# Patient Record
Sex: Male | Born: 1975 | Race: White | Hispanic: No | Marital: Married | Smoking: Never smoker
Health system: Southern US, Community
[De-identification: ages and names within clinical notes are randomized; demographics above are authoritative.]

## PROBLEM LIST (undated history)

## (undated) DIAGNOSIS — K219 Gastro-esophageal reflux disease without esophagitis: Secondary | ICD-10-CM

## (undated) HISTORY — PX: VASECTOMY: SHX75

## (undated) HISTORY — PX: OTHER SURGICAL HISTORY: SHX169

---

## 2013-09-04 ENCOUNTER — Emergency Department (HOSPITAL_COMMUNITY)
Admission: EM | Admit: 2013-09-04 | Discharge: 2013-09-05 | Disposition: A | Discharge status: Home / Self Care | Payer: BC Managed Care – PPO | Attending: Emergency Medicine | Admitting: Emergency Medicine

## 2013-09-04 ENCOUNTER — Encounter (HOSPITAL_COMMUNITY): Payer: Self-pay | Admitting: Emergency Medicine

## 2013-09-04 ENCOUNTER — Emergency Department (HOSPITAL_COMMUNITY): Payer: BC Managed Care – PPO

## 2013-09-04 DIAGNOSIS — K219 Gastro-esophageal reflux disease without esophagitis: Secondary | ICD-10-CM | POA: Insufficient documentation

## 2013-09-04 DIAGNOSIS — Z79899 Other long term (current) drug therapy: Secondary | ICD-10-CM | POA: Insufficient documentation

## 2013-09-04 DIAGNOSIS — R002 Palpitations: Secondary | ICD-10-CM | POA: Insufficient documentation

## 2013-09-04 LAB — BASIC METABOLIC PANEL
Anion gap: 16 — ABNORMAL HIGH (ref 5–15)
BUN: 15 mg/dL (ref 6–23)
CHLORIDE: 98 meq/L (ref 96–112)
CO2: 26 mEq/L (ref 19–32)
Calcium: 9.8 mg/dL (ref 8.4–10.5)
Creatinine, Ser: 1.01 mg/dL (ref 0.50–1.35)
GFR calc Af Amer: >90 mL/min (ref >90)
GLUCOSE: 101 mg/dL — AB (ref 70–99)
POTASSIUM: 3.9 meq/L (ref 3.7–5.3)
SODIUM: 140 meq/L (ref 137–147)

## 2013-09-04 LAB — CBC
HEMATOCRIT: 45 % (ref 39.0–52.0)
HEMOGLOBIN: 15.3 g/dL (ref 13.0–17.0)
MCH: 30.3 pg (ref 26.0–34.0)
MCHC: 34 g/dL (ref 30.0–36.0)
MCV: 89.1 fL (ref 78.0–100.0)
Platelets: 213 10*3/uL (ref 150–400)
RBC: 5.05 MIL/uL (ref 4.22–5.81)
RDW: 12.6 % (ref 11.5–15.5)
WBC: 6.6 10*3/uL (ref 4.0–10.5)

## 2013-09-04 LAB — TROPONIN I

## 2013-09-04 NOTE — ED Notes (Signed)
The pt is c/o tightness since Friday with an irregular pulse. He has not taken anything for pain.Brian Case.  No previous history.  Alert no distress

## 2013-09-05 MED ORDER — PANTOPRAZOLE SODIUM 20 MG PO TBEC: 40.0000 mg | DELAYED_RELEASE_TABLET | Freq: Every day | ORAL | Status: DC

## 2013-09-05 NOTE — Discharge Instructions (Signed)
Your workup today did not show any specific abnormalities.  Please follow up with your doctor for further workup for your palpitations if they continue.  Return to the ER for worsening condition or new concerning symptoms.   Gastroesophageal Reflux Disease, Adult Gastroesophageal reflux disease (GERD) happens when acid from your stomach flows up into the esophagus. When acid comes in contact with the esophagus, the acid causes soreness (inflammation) in the esophagus. Over time, GERD may create small holes (ulcers) in the lining of the esophagus. CAUSES   Increased body weight. This puts pressure on the stomach, making acid rise from the stomach into the esophagus.  Smoking. This increases acid production in the stomach.  Drinking alcohol. This causes decreased pressure in the lower esophageal sphincter (valve or ring of muscle between the esophagus and stomach), allowing acid from the stomach into the esophagus.  Late evening meals and a full stomach. This increases pressure and acid production in the stomach.  A malformed lower esophageal sphincter. Sometimes, no cause is found. SYMPTOMS   Burning pain in the lower part of the mid-chest behind the breastbone and in the mid-stomach area. This may occur twice a week or more often.  Trouble swallowing.  Sore throat.  Dry cough.  Asthma-like symptoms including chest tightness, shortness of breath, or wheezing. DIAGNOSIS  Your caregiver may be able to diagnose GERD based on your symptoms. In some cases, X-rays and other tests may be done to check for complications or to check the condition of your stomach and esophagus. TREATMENT  Your caregiver may recommend over-the-counter or prescription medicines to help decrease acid production. Ask your caregiver before starting or adding any new medicines.  HOME CARE INSTRUCTIONS   Change the factors that you can control. Ask your caregiver for guidance concerning weight loss, quitting smoking,  and alcohol consumption.  Avoid foods and drinks that make your symptoms worse, such as:  Caffeine or alcoholic drinks.  Chocolate.  Peppermint or mint flavorings.  Garlic and onions.  Spicy foods.  Citrus fruits, such as oranges, lemons, or limes.  Tomato-based foods such as sauce, chili, salsa, and pizza.  Fried and fatty foods.  Avoid lying down for the 3 hours prior to your bedtime or prior to taking a nap.  Eat small, frequent meals instead of large meals.  Wear loose-fitting clothing. Do not wear anything tight around your waist that causes pressure on your stomach.  Raise the head of your bed 6 to 8 inches with wood blocks to help you sleep. Extra pillows will not help.  Only take over-the-counter or prescription medicines for pain, discomfort, or fever as directed by your caregiver.  Do not take aspirin, ibuprofen, or other nonsteroidal anti-inflammatory drugs (NSAIDs). SEEK IMMEDIATE MEDICAL CARE IF:   You have pain in your arms, neck, jaw, teeth, or back.  Your pain increases or changes in intensity or duration.  You develop nausea, vomiting, or sweating (diaphoresis).  You develop shortness of breath, or you faint.  Your vomit is green, yellow, black, or looks like coffee grounds or blood.  Your stool is red, bloody, or black. These symptoms could be signs of other problems, such as heart disease, gastric bleeding, or esophageal bleeding. MAKE SURE YOU:   Understand these instructions.  Will watch your condition.  Will get help right away if you are not doing well or get worse. Document Released: 11/20/2004 Document Revised: 05/05/2011 Document Reviewed: 08/30/2010 [facility] Patient Information 2015 [place], [place]. This information is not intended to  replace advice given to you by your health care provider. Make sure you discuss any questions you have with your health care provider.  Food Choices for Gastroesophageal Reflux Disease When you have  gastroesophageal reflux disease (GERD), the foods you eat and your eating habits are very important. Choosing the right foods can help ease the discomfort of GERD. WHAT GENERAL GUIDELINES DO I NEED TO FOLLOW?  Choose fruits, vegetables, whole grains, low-fat dairy products, and low-fat meat, fish, and poultry.  Limit fats such as oils, salad dressings, butter, nuts, and avocado.  Keep a food diary to identify foods that cause symptoms.  Avoid foods that cause reflux. These may be different for different people.  Eat frequent small meals instead of three large meals each day.  Eat your meals slowly, in a relaxed setting.  Limit fried foods.  Cook foods using methods other than frying.  Avoid drinking alcohol.  Avoid drinking large amounts of liquids with your meals.  Avoid bending over or lying down until 2-3 hours after eating. WHAT FOODS ARE NOT RECOMMENDED? The following are some foods and drinks that may worsen your symptoms: Vegetables Tomatoes. Tomato juice. Tomato and spaghetti sauce. Chili peppers. Onion and garlic. Horseradish. Fruits Oranges, grapefruit, and lemon (fruit and juice). Meats High-fat meats, fish, and poultry. This includes hot dogs, ribs, ham, sausage, salami, and bacon. Dairy Whole milk and chocolate milk. Sour cream. Cream. Butter. Ice cream. Cream cheese.  Beverages Coffee and tea, with or without caffeine. Carbonated beverages or energy drinks. Condiments Hot sauce. Barbecue sauce.  Sweets/Desserts Chocolate and cocoa. Donuts. Peppermint and spearmint. Fats and Oils High-fat foods, including [place] fries and potato chips. Other Vinegar. Strong spices, such as black pepper, white pepper, red pepper, cayenne, curry powder, cloves, ginger, and chili powder. The items listed above may not be a complete list of foods and beverages to avoid. Contact your dietitian for more information. Document Released: 02/10/2005 Document Revised: 02/15/2013  Document Reviewed: 12/15/2012 [facility] Patient Information 2015 [place]. This information is not intended to replace advice given to you by your health care provider. Make sure you discuss any questions you have with your health care provider.  Palpitations A palpitation is the feeling that your heartbeat is irregular or is faster than normal. It may feel like your heart is fluttering or skipping a beat. Palpitations are usually not a serious problem. However, in some cases, you may need further medical evaluation. CAUSES  Palpitations can be caused by:  Smoking.  Caffeine or other stimulants, such as diet pills or energy drinks.  Alcohol.  Stress and anxiety.  Strenuous physical activity.  Fatigue.  Certain medicines.  Heart disease, especially if you have a history of irregular heart rhythms (arrhythmias), such as atrial fibrillation, atrial flutter, or supraventricular tachycardia.  An improperly working pacemaker or defibrillator. DIAGNOSIS  To find the cause of your palpitations, your health care provider will take your medical history and perform a physical exam. Your health care provider may also have you take a test called an ambulatory electrocardiogram (ECG). An ECG records your heartbeat patterns over a 24-hour period. You may also have other tests, such as:  Transthoracic echocardiogram (TTE). During echocardiography, sound waves are used to evaluate how blood flows through your heart.  Transesophageal echocardiogram (TEE).  Cardiac monitoring. This allows your health care provider to monitor your heart rate and rhythm in real time.  Holter monitor. This is a portable device that records your heartbeat and can help diagnose heart  arrhythmias. It allows your health care provider to track your heart activity for several days, if needed.  Stress tests by exercise or by giving medicine that makes the heart beat faster. TREATMENT  Treatment of palpitations depends  on the cause of your symptoms and can vary greatly. Most cases of palpitations do not require any treatment other than time, relaxation, and monitoring your symptoms. Other causes, such as atrial fibrillation, atrial flutter, or supraventricular tachycardia, usually require further treatment. HOME CARE INSTRUCTIONS   Avoid:  Caffeinated coffee, tea, soft drinks, diet pills, and energy drinks.  Chocolate.  Alcohol.  Stop smoking if you smoke.  Reduce your stress and anxiety. Things that can help you relax include:  A method of controlling things in your body, such as your heartbeats, with your mind (biofeedback).  Yoga.  Meditation.  Physical activity such as swimming, jogging, or walking.  Get plenty of rest and sleep. SEEK MEDICAL CARE IF:   You continue to have a fast or irregular heartbeat beyond 24 hours.  Your palpitations occur more often. SEEK IMMEDIATE MEDICAL CARE IF:  You have chest pain or shortness of breath.  You have a severe headache.  You feel dizzy or you faint. MAKE SURE YOU:  Understand these instructions.  Will watch your condition.  Will get help right away if you are not doing well or get worse. Document Released: 02/08/2000 Document Revised: 02/15/2013 Document Reviewed: 04/11/2011 [facility] Patient Information 2015 [place], [place]. This information is not intended to replace advice given to you by your health care provider. Make sure you discuss any questions you have with your health care provider.

## 2013-09-05 NOTE — ED Provider Notes (Signed)
CSN: [id number]     Arrival date & time 09/04/13  2225 History   First MD Initiated Contact with Patient 09/05/13 0000     Chief Complaint  Patient presents with  . Chest Pain     (Consider location/radiation/quality/duration/timing/severity/associated sxs/prior Treatment) HPI Shrill male presents to emergency department with complaint of palpitations reflux symptoms and burping.  Patient reports eating a large Chinese meal Friday and afterwards noted including repeating acetate the back of his mouth.  With some burning in his throat.  Patient on Saturday he reports he felt generally unwell, no specific complaints, just general malaise.  That evening when he was lying down he noticed that he was having extra beats.  At times he felt like his heart was racing.  Patient had persistent symptoms today.  He denies any specific chest pain shortness of breath leg swelling prolonged immobilization.  He is nonsmoker.  No family history of PE DVT or heart disease.  Patient reports he has not been exercising as regularly as he normally does for the past month, prior to that time was running regularly.  Patient has been under more stress recently.  He normally drinks a cup of coffee a day.  History reviewed. No pertinent past medical history. History reviewed. No pertinent past surgical history. No family history on file. History  Substance Use Topics  . Smoking status: Never Smoker   . Smokeless tobacco: Not on file  . Alcohol Use: Yes    Review of Systems   See History of Present Illness; otherwise all other systems are reviewed and negative  Allergies  Review of patient's allergies indicates no known allergies.  Home Medications   Prior to Admission medications   Medication Sig Start Date End Date Taking? Authorizing Provider  pantoprazole (PROTONIX) 20 MG tablet Take 2 tablets (40 mg total) by mouth daily. 09/05/13   Olivia Mackie, MD   BP 122/83  Pulse 56  Temp(Src) 97.9 F (36.6 C)  (Oral)  Resp 18  Ht 6\' 2"  (1.88 m)  Wt 200 lb (90.719 kg)  BMI 25.67 kg/m2  SpO2 98% Physical Exam  Nursing note and vitals reviewed. Constitutional: He is oriented to person, place, and time. He appears well-developed and well-nourished.  HENT:  Head: Normocephalic and atraumatic.  Nose: Nose normal.  Mouth/Throat: Oropharynx is clear and moist.  Eyes: Conjunctivae and EOM are normal. Pupils are equal, round, and reactive to light.  Neck: Normal range of motion. Neck supple. No JVD present. No tracheal deviation present. No thyromegaly present.  Cardiovascular: Normal rate, regular rhythm, normal heart sounds and intact distal pulses.  Exam reveals no gallop and no friction rub.   No murmur heard. Pulmonary/Chest: Effort normal and breath sounds normal. No stridor. No respiratory distress. He has no wheezes. He has no rales. He exhibits no tenderness.  Abdominal: Soft. Bowel sounds are normal. He exhibits no distension and no mass. There is no tenderness. There is no rebound and no guarding.  Musculoskeletal: Normal range of motion. He exhibits no edema and no tenderness.  Lymphadenopathy:    He has no cervical adenopathy.  Neurological: He is alert and oriented to person, place, and time. He exhibits normal muscle tone. Coordination normal.  Skin: Skin is dry. No rash noted. No erythema. No pallor.  Psychiatric: He has a normal mood and affect. His behavior is normal. Judgment and thought content normal.    ED Course  Procedures (including critical care time) Labs Review Labs Reviewed  BASIC  METABOLIC PANEL - Abnormal; Notable for the following:    Glucose, Bld 101 (*)    Anion gap 16 (*)    All other components within normal limits  CBC  TROPONIN I    Imaging Review Dg Chest 2 View  09/04/2013   CLINICAL DATA:  Chest pain  EXAM: CHEST  2 VIEW  COMPARISON:  None.  FINDINGS: Lungs are clear. The heart size and pulmonary vascularity are normal. No adenopathy. No pneumothorax.  No bone lesions.  IMPRESSION: No abnormality noted.   Electronically Signed   By: Bretta BangWilliam  Woodruff M.D.   On: 09/04/2013 23:18     EKG Interpretation   Date/Time:  Sunday September 04 2013 22:36:02 EDT Ventricular Rate:  61 PR Interval:  126 QRS Duration: 98 QT Interval:  386 QTC Calculation: 388 R Axis:   69 Text Interpretation:  Normal sinus rhythm Nonspecific ST and T wave  abnormality Abnormal ECG No old tracing to compare Confirmed by Tavon Magnussen  MD,  Beck Cofer ([id number]) on 09/05/2013 12:05:25 AM      MDM   Final diagnoses:  Palpitations  Gastroesophageal reflux disease without esophagitis    a 38 year old male with 3 days of intermittent palpitations, reflux symptoms.  His workup has unremarkable, has no specific risk factors.  Patient advised that should symptoms continue with palpitations should followup with primary care doctor for Holter monitor.  We'll start him on Protonix for reflux symptoms.  Olivia Mackielga M Anurag Scarfo, MD 09/05/13 [phone]

## 2014-10-05 ENCOUNTER — Other Ambulatory Visit: Payer: Self-pay | Admitting: Surgical

## 2014-10-06 NOTE — Patient Instructions (Addendum)
Brian Case  10/06/2014   Your procedure is scheduled on: Wednesday 10/11/2014  Report to [facility] Main  Entrance take [facility]  elevators to 3rd floor to  Short Stay Center at  0900 AM.  Call this number if you have problems the morning of surgery [phone]   Remember: ONLY 1 PERSON MAY GO WITH YOU TO SHORT STAY TO GET  READY MORNING OF YOUR SURGERY.  Do not eat food or drink liquids :After Midnight.     Take these medicines the morning of surgery with A SIP OF WATER: none                                You may not have any metal on your body including hair pins and              piercings  Do not wear jewelry,  lotions, powders or perfumes, deodorant                        Men may shave face and neck.   Do not bring valuables to the hospital. Brian Case IS NOT             RESPONSIBLE   FOR VALUABLES.  Contacts, dentures or bridgework may not be worn into surgery.  Leave suitcase in the car. After surgery it may be brought to your room.     Patients discharged the day of surgery will not be allowed to drive home.  Name and phone number of your driver:  Special Instructions: coughing and deep breathing exercises, leg exercises               Please read over the following fact sheets you were given: _____________________________________________________________________             [facility] - Preparing for Surgery Before surgery, you can play an important role.  Because skin is not sterile, your skin needs to be as free of germs as possible.  You can reduce the number of germs on your skin by washing with CHG (chlorahexidine gluconate) soap before surgery.  CHG is an antiseptic cleaner which kills germs and bonds with the skin to continue killing germs even after washing. Please DO NOT use if you have an allergy to CHG or antibacterial soaps.  If your skin becomes reddened/irritated stop using the CHG and inform your nurse when you arrive at Short  Stay. Do not shave (including legs and underarms) for at least 48 hours prior to the first CHG shower.  You may shave your face/neck. Please follow these instructions carefully:  1.  Shower with CHG Soap the night before surgery and the  morning of Surgery.  2.  If you choose to wash your hair, wash your hair first as usual with your  normal  shampoo.  3.  After you shampoo, rinse your hair and body thoroughly to remove the  shampoo.                           4.  Use CHG as you would any other liquid soap.  You can apply chg directly  to the skin and wash  Gently with a scrungie or clean washcloth.  5.  Apply the CHG Soap to your body ONLY FROM THE NECK DOWN.   Do not use on face/ open                           Wound or open sores. Avoid contact with eyes, ears mouth and genitals (private parts).                       Wash face,  Genitals (private parts) with your normal soap.             6.  Wash thoroughly, paying special attention to the area where your surgery  will be performed.  7.  Thoroughly rinse your body with warm water from the neck down.  8.  DO NOT shower/wash with your normal soap after using and rinsing off  the CHG Soap.                9.  Pat yourself dry with a clean towel.            10.  Wear clean pajamas.            11.  Place clean sheets on your bed the night of your first shower and do not  sleep with pets. Day of Surgery : Do not apply any lotions/deodorants the morning of surgery.  Please wear clean clothes to the hospital/surgery center.  FAILURE TO FOLLOW THESE INSTRUCTIONS MAY RESULT IN THE CANCELLATION OF YOUR SURGERY PATIENT SIGNATURE_________________________________  NURSE SIGNATURE__________________________________  ________________________________________________________________________   Brian Case  An incentive spirometer is a tool that can help keep your lungs clear and active. This tool measures how well you are  filling your lungs with each breath. Taking long deep breaths may help reverse or decrease the chance of developing breathing (pulmonary) problems (especially infection) following:  A long period of time when you are unable to move or be active. BEFORE THE PROCEDURE   If the spirometer includes an indicator to show your best effort, your nurse or respiratory therapist will set it to a desired goal.  If possible, sit up straight or lean slightly forward. Try not to slouch.  Hold the incentive spirometer in an upright position. INSTRUCTIONS FOR USE   Sit on the edge of your bed if possible, or sit up as far as you can in bed or on a chair.  Hold the incentive spirometer in an upright position.  Breathe out normally.  Place the mouthpiece in your mouth and seal your lips tightly around it.  Breathe in slowly and as deeply as possible, raising the piston or the ball toward the top of the column.  Hold your breath for 3-5 seconds or for as long as possible. Allow the piston or ball to fall to the bottom of the column.  Remove the mouthpiece from your mouth and breathe out normally.  Rest for a few seconds and repeat Steps 1 through 7 at least 10 times every 1-2 hours when you are awake. Take your time and take a few normal breaths between deep breaths.  The spirometer may include an indicator to show your best effort. Use the indicator as a goal to work toward during each repetition.  After each set of 10 deep breaths, practice coughing to be sure your lungs are clear. If you have an incision (the cut made at the time of surgery),  support your incision when coughing by placing a pillow or rolled up towels firmly against it. Once you are able to get out of bed, walk around indoors and cough well. You may stop using the incentive spirometer when instructed by your caregiver.  RISKS AND COMPLICATIONS  Take your time so you do not get dizzy or light-headed.  If you are in pain, you may  need to take or ask for pain medication before doing incentive spirometry. It is harder to take a deep breath if you are having pain. AFTER USE  Rest and breathe slowly and easily.  It can be helpful to keep track of a log of your progress. Your caregiver can provide you with a simple table to help with this. If you are using the spirometer at home, follow these instructions: [place] IF:   You are having difficultly using the spirometer.  You have trouble using the spirometer as often as instructed.  Your pain medication is not giving enough relief while using the spirometer.  You develop fever of 100.5 F (38.1 C) or higher. SEEK IMMEDIATE MEDICAL CARE IF:   You cough up bloody sputum that had not been present before.  You develop fever of 102 F (38.9 C) or greater.  You develop worsening pain at or near the incision site. MAKE SURE YOU:   Understand these instructions.  Will watch your condition.  Will get help right away if you are not doing well or get worse. Document Released: 06/23/2006 Document Revised: 05/05/2011 Document Reviewed: 08/24/2006 Exit[employer] Patient Information 2014 Exit[employer], [place].   ________________________________________________________________________  WHAT IS A BLOOD TRANSFUSION? Blood Transfusion Information  A transfusion is the replacement of blood or some of its parts. Blood is made up of multiple cells which provide different functions.  Red blood cells carry oxygen and are used for blood loss replacement.  White blood cells fight against infection.  Platelets control bleeding.  Plasma helps clot blood.  Other blood products are available for specialized needs, such as hemophilia or other clotting disorders. BEFORE THE TRANSFUSION  Who gives blood for transfusions?   Healthy volunteers who are fully evaluated to make sure their blood is safe. This is blood bank blood. Transfusion therapy is the safest it has ever been in  the practice of medicine. Before blood is taken from a donor, a complete history is taken to make sure that person has no history of diseases nor engages in risky social behavior (examples are intravenous drug use or sexual activity with multiple partners). The donor's travel history is screened to minimize risk of transmitting infections, such as malaria. The donated blood is tested for signs of infectious diseases, such as HIV and hepatitis. The blood is then tested to be sure it is compatible with you in order to minimize the chance of a transfusion reaction. If you or a relative donates blood, this is often done in anticipation of surgery and is not appropriate for emergency situations. It takes many days to process the donated blood. RISKS AND COMPLICATIONS Although transfusion therapy is very safe and saves many lives, the main dangers of transfusion include:   Getting an infectious disease.  Developing a transfusion reaction. This is an allergic reaction to something in the blood you were given. Every precaution is taken to prevent this. The decision to have a blood transfusion has been considered carefully by your caregiver before blood is given. Blood is not given unless the benefits outweigh the risks. AFTER THE TRANSFUSION  Right after receiving a blood transfusion, you will usually feel much better and more energetic. This is especially true if your red blood cells have gotten low (anemic). The transfusion raises the level of the red blood cells which carry oxygen, and this usually causes an energy increase.  The nurse administering the transfusion will monitor you carefully for complications. HOME CARE INSTRUCTIONS  No special instructions are needed after a transfusion. You may find your energy is better. Speak with your caregiver about any limitations on activity for underlying diseases you may have. SEEK MEDICAL CARE IF:   Your condition is not improving after your  transfusion.  You develop redness or irritation at the intravenous (IV) site. SEEK IMMEDIATE MEDICAL CARE IF:  Any of the following symptoms occur over the next 12 hours:  Shaking chills.  You have a temperature by mouth above 102 F (38.9 C), not controlled by medicine.  Chest, back, or muscle pain.  People around you feel you are not acting correctly or are confused.  Shortness of breath or difficulty breathing.  Dizziness and fainting.  You get a rash or develop hives.  You have a decrease in urine output.  Your urine turns a dark color or changes to pink, red, or brown. Any of the following symptoms occur over the next 10 days:  You have a temperature by mouth above 102 F (38.9 C), not controlled by medicine.  Shortness of breath.  Weakness after normal activity.  The white part of the eye turns yellow (jaundice).  You have a decrease in the amount of urine or are urinating less often.  Your urine turns a dark color or changes to pink, red, or brown. Document Released: 02/08/2000 Document Revised: 05/05/2011 Document Reviewed: 09/27/2007 [facility] Patient Information 2014 [place], [place].  _______________________________________________________________________

## 2014-10-09 ENCOUNTER — Ambulatory Visit (HOSPITAL_COMMUNITY)
Admission: RE | Admit: 2014-10-09 | Discharge: 2014-10-09 | Disposition: A | Discharge status: Home / Self Care | Payer: BLUE CROSS/BLUE SHIELD | Source: Ambulatory Visit | Attending: Surgical | Admitting: Surgical

## 2014-10-09 ENCOUNTER — Encounter (HOSPITAL_COMMUNITY): Payer: Self-pay

## 2014-10-09 ENCOUNTER — Encounter (HOSPITAL_COMMUNITY)
Admission: RE | Admit: 2014-10-09 | Discharge: 2014-10-09 | Disposition: A | Discharge status: Home / Self Care | Payer: BLUE CROSS/BLUE SHIELD | Source: Ambulatory Visit | Attending: Orthopedic Surgery | Admitting: Orthopedic Surgery

## 2014-10-09 DIAGNOSIS — Z01818 Encounter for other preprocedural examination: Secondary | ICD-10-CM | POA: Diagnosis present

## 2014-10-09 DIAGNOSIS — M5136 Other intervertebral disc degeneration, lumbar region: Secondary | ICD-10-CM | POA: Insufficient documentation

## 2014-10-09 DIAGNOSIS — R9431 Abnormal electrocardiogram [ECG] [EKG]: Secondary | ICD-10-CM | POA: Diagnosis not present

## 2014-10-09 DIAGNOSIS — Z01812 Encounter for preprocedural laboratory examination: Secondary | ICD-10-CM | POA: Diagnosis not present

## 2014-10-09 HISTORY — DX: Gastro-esophageal reflux disease without esophagitis: K21.9

## 2014-10-09 LAB — SURGICAL PCR SCREEN
MRSA, PCR: NEGATIVE
Staphylococcus aureus: NEGATIVE

## 2014-10-09 LAB — CBC WITH DIFFERENTIAL/PLATELET
Basophils Absolute: 0 10*3/uL (ref 0.0–0.1)
Basophils Relative: 0 % (ref 0–1)
Eosinophils Absolute: 0 10*3/uL (ref 0.0–0.7)
Eosinophils Relative: 0 % (ref 0–5)
HCT: 48.4 % (ref 39.0–52.0)
Hemoglobin: 16.5 g/dL (ref 13.0–17.0)
Lymphocytes Relative: 13 % (ref 12–46)
Lymphs Abs: 0.8 10*3/uL (ref 0.7–4.0)
MCH: 30.3 pg (ref 26.0–34.0)
MCHC: 34.1 g/dL (ref 30.0–36.0)
MCV: 89 fL (ref 78.0–100.0)
Monocytes Absolute: 0.1 10*3/uL (ref 0.1–1.0)
Monocytes Relative: 2 % — ABNORMAL LOW (ref 3–12)
Neutro Abs: 5.3 10*3/uL (ref 1.7–7.7)
Neutrophils Relative %: 85 % — ABNORMAL HIGH (ref 43–77)
Platelets: 271 10*3/uL (ref 150–400)
RBC: 5.44 MIL/uL (ref 4.22–5.81)
RDW: 12.5 % (ref 11.5–15.5)
WBC: 6.3 10*3/uL (ref 4.0–10.5)

## 2014-10-09 LAB — COMPREHENSIVE METABOLIC PANEL
ALT: 29 U/L (ref 17–63)
AST: 27 U/L (ref 15–41)
Albumin: 4.8 g/dL (ref 3.5–5.0)
Alkaline Phosphatase: 73 U/L (ref 38–126)
Anion gap: 10 (ref 5–15)
BUN: 13 mg/dL (ref 6–20)
CO2: 26 mmol/L (ref 22–32)
Calcium: 10 mg/dL (ref 8.9–10.3)
Chloride: 102 mmol/L (ref 101–111)
Creatinine, Ser: 0.84 mg/dL (ref 0.61–1.24)
GFR calc Af Amer: >60 mL/min (ref >60)
GFR calc non Af Amer: >60 mL/min (ref >60)
Glucose, Bld: 123 mg/dL — ABNORMAL HIGH (ref 65–99)
Potassium: 4.1 mmol/L (ref 3.5–5.1)
Sodium: 138 mmol/L (ref 135–145)
Total Bilirubin: 0.6 mg/dL (ref 0.3–1.2)
Total Protein: 7.8 g/dL (ref 6.5–8.1)

## 2014-10-09 LAB — PROTIME-INR
INR: 0.94 (ref 0.00–1.49)
Prothrombin Time: 12.8 seconds (ref 11.6–15.2)

## 2014-10-11 ENCOUNTER — Ambulatory Visit (HOSPITAL_COMMUNITY): Payer: BLUE CROSS/BLUE SHIELD

## 2014-10-11 ENCOUNTER — Encounter (HOSPITAL_COMMUNITY)
Admission: RE | Disposition: A | Discharge status: Home / Self Care | Payer: Self-pay | Source: Ambulatory Visit | Attending: Orthopedic Surgery

## 2014-10-11 ENCOUNTER — Encounter (HOSPITAL_COMMUNITY): Payer: Self-pay | Admitting: *Deleted

## 2014-10-11 ENCOUNTER — Observation Stay (HOSPITAL_COMMUNITY)
Admission: RE | Admit: 2014-10-11 | Discharge: 2014-10-12 | Disposition: A | Discharge status: Home / Self Care | Payer: BLUE CROSS/BLUE SHIELD | Source: Ambulatory Visit | Attending: Orthopedic Surgery | Admitting: Orthopedic Surgery

## 2014-10-11 ENCOUNTER — Ambulatory Visit (HOSPITAL_COMMUNITY): Payer: BLUE CROSS/BLUE SHIELD | Admitting: Anesthesiology

## 2014-10-11 DIAGNOSIS — M5127 Other intervertebral disc displacement, lumbosacral region: Secondary | ICD-10-CM | POA: Diagnosis not present

## 2014-10-11 DIAGNOSIS — K219 Gastro-esophageal reflux disease without esophagitis: Secondary | ICD-10-CM | POA: Insufficient documentation

## 2014-10-11 DIAGNOSIS — M4807 Spinal stenosis, lumbosacral region: Principal | ICD-10-CM | POA: Insufficient documentation

## 2014-10-11 DIAGNOSIS — M48062 Spinal stenosis, lumbar region with neurogenic claudication: Secondary | ICD-10-CM | POA: Diagnosis present

## 2014-10-11 DIAGNOSIS — Z79899 Other long term (current) drug therapy: Secondary | ICD-10-CM | POA: Insufficient documentation

## 2014-10-11 DIAGNOSIS — R29898 Other symptoms and signs involving the musculoskeletal system: Secondary | ICD-10-CM | POA: Insufficient documentation

## 2014-10-11 DIAGNOSIS — Z7952 Long term (current) use of systemic steroids: Secondary | ICD-10-CM | POA: Insufficient documentation

## 2014-10-11 DIAGNOSIS — Z419 Encounter for procedure for purposes other than remedying health state, unspecified: Secondary | ICD-10-CM

## 2014-10-11 HISTORY — PX: HEMI-MICRODISCECTOMY LUMBAR LAMINECTOMY LEVEL 1: SHX5846

## 2014-10-11 SURGERY — HEMI-MICRODISCECTOMY LUMBAR LAMINECTOMY LEVEL 1
Anesthesia: General | Site: Back | Laterality: Left

## 2014-10-11 MED ORDER — HYDROCODONE-ACETAMINOPHEN 5-325 MG PO TABS: 1.0000 | ORAL_TABLET | ORAL | Status: DC | PRN

## 2014-10-11 MED ORDER — SUGAMMADEX SODIUM 200 MG/2ML IV SOLN
INTRAVENOUS | Status: AC
  Filled 2014-10-11: qty 2

## 2014-10-11 MED ORDER — CEFAZOLIN SODIUM-DEXTROSE 2-3 GM-% IV SOLR
INTRAVENOUS | Status: AC
  Filled 2014-10-11: qty 50

## 2014-10-11 MED ORDER — HYDROMORPHONE HCL 1 MG/ML IJ SOLN
INTRAMUSCULAR | Status: AC
  Filled 2014-10-11: qty 1

## 2014-10-11 MED ORDER — SODIUM CHLORIDE 0.9 % IR SOLN
Status: AC
  Filled 2014-10-11: qty 1

## 2014-10-11 MED ORDER — FENTANYL CITRATE (PF) 250 MCG/5ML IJ SOLN
INTRAMUSCULAR | Status: DC | PRN
  Administered 2014-10-11: 150 ug via INTRAVENOUS
  Administered 2014-10-11 (×2): 50 ug via INTRAVENOUS

## 2014-10-11 MED ORDER — CEFAZOLIN SODIUM-DEXTROSE 2-3 GM-% IV SOLR
2.0000 g | INTRAVENOUS | Status: AC
  Administered 2014-10-11: 2 g via INTRAVENOUS

## 2014-10-11 MED ORDER — BACITRACIN-NEOMYCIN-POLYMYXIN 400-5-5000 EX OINT
TOPICAL_OINTMENT | CUTANEOUS | Status: AC
  Filled 2014-10-11: qty 1

## 2014-10-11 MED ORDER — BUPIVACAINE-EPINEPHRINE (PF) 0.5% -1:200000 IJ SOLN
INTRAMUSCULAR | Status: AC
  Filled 2014-10-11: qty 30

## 2014-10-11 MED ORDER — KETOROLAC TROMETHAMINE 30 MG/ML IJ SOLN
INTRAMUSCULAR | Status: AC
  Filled 2014-10-11: qty 1

## 2014-10-11 MED ORDER — BISACODYL 5 MG PO TBEC: 5.0000 mg | DELAYED_RELEASE_TABLET | Freq: Every day | ORAL | Status: DC | PRN

## 2014-10-11 MED ORDER — MIDAZOLAM HCL 2 MG/2ML IJ SOLN
INTRAMUSCULAR | Status: AC
  Filled 2014-10-11: qty 4

## 2014-10-11 MED ORDER — ACETAMINOPHEN 325 MG PO TABS: 650.0000 mg | ORAL_TABLET | ORAL | Status: DC | PRN

## 2014-10-11 MED ORDER — CHLORHEXIDINE GLUCONATE 4 % EX LIQD: 60.0000 mL | Freq: Once | CUTANEOUS | Status: DC

## 2014-10-11 MED ORDER — HYDROMORPHONE HCL 1 MG/ML IJ SOLN: 0.5000 mg | INTRAMUSCULAR | Status: DC | PRN

## 2014-10-11 MED ORDER — BUPIVACAINE-EPINEPHRINE (PF) 0.5% -1:200000 IJ SOLN
INTRAMUSCULAR | Status: DC | PRN
  Administered 2014-10-11: 20 mL

## 2014-10-11 MED ORDER — MENTHOL 3 MG MT LOZG: 1.0000 | LOZENGE | OROMUCOSAL | Status: DC | PRN

## 2014-10-11 MED ORDER — PROMETHAZINE HCL 25 MG/ML IJ SOLN: 6.2500 mg | INTRAMUSCULAR | Status: DC | PRN

## 2014-10-11 MED ORDER — HYDROMORPHONE HCL 1 MG/ML IJ SOLN
INTRAMUSCULAR | Status: DC | PRN
  Administered 2014-10-11 (×2): 1 mg via INTRAVENOUS

## 2014-10-11 MED ORDER — SUGAMMADEX SODIUM 200 MG/2ML IV SOLN
INTRAVENOUS | Status: DC | PRN
  Administered 2014-10-11: 200 mg via INTRAVENOUS

## 2014-10-11 MED ORDER — PROPOFOL 10 MG/ML IV BOLUS
INTRAVENOUS | Status: DC | PRN
  Administered 2014-10-11: 200 mg via INTRAVENOUS

## 2014-10-11 MED ORDER — ONDANSETRON HCL 4 MG/2ML IJ SOLN: 4.0000 mg | INTRAMUSCULAR | Status: DC | PRN

## 2014-10-11 MED ORDER — DEXAMETHASONE SODIUM PHOSPHATE 10 MG/ML IJ SOLN
INTRAMUSCULAR | Status: DC | PRN
Start: 2014-10-11 — End: 2014-10-11
  Administered 2014-10-11: 10 mg via INTRAVENOUS

## 2014-10-11 MED ORDER — PROPOFOL 10 MG/ML IV BOLUS
INTRAVENOUS | Status: AC
  Filled 2014-10-11: qty 20

## 2014-10-11 MED ORDER — METHOCARBAMOL 500 MG PO TABS: 500.0000 mg | ORAL_TABLET | Freq: Four times a day (QID) | ORAL | Status: DC | PRN

## 2014-10-11 MED ORDER — DEXAMETHASONE SODIUM PHOSPHATE 10 MG/ML IJ SOLN
INTRAMUSCULAR | Status: AC
  Filled 2014-10-11: qty 1

## 2014-10-11 MED ORDER — LIDOCAINE HCL (CARDIAC) 20 MG/ML IV SOLN
INTRAVENOUS | Status: AC
  Filled 2014-10-11: qty 5

## 2014-10-11 MED ORDER — ONDANSETRON HCL 4 MG/2ML IJ SOLN
INTRAMUSCULAR | Status: AC
  Filled 2014-10-11: qty 2

## 2014-10-11 MED ORDER — POLYETHYLENE GLYCOL 3350 17 G PO PACK: 17.0000 g | PACK | Freq: Every day | ORAL | Status: DC | PRN

## 2014-10-11 MED ORDER — ROCURONIUM BROMIDE 100 MG/10ML IV SOLN
INTRAVENOUS | Status: DC | PRN
  Administered 2014-10-11 (×2): 10 mg via INTRAVENOUS
  Administered 2014-10-11: 50 mg via INTRAVENOUS
  Administered 2014-10-11 (×2): 10 mg via INTRAVENOUS

## 2014-10-11 MED ORDER — LIDOCAINE HCL (CARDIAC) 20 MG/ML IV SOLN
INTRAVENOUS | Status: DC | PRN
  Administered 2014-10-11: 50 mg via INTRAVENOUS

## 2014-10-11 MED ORDER — ROCURONIUM BROMIDE 100 MG/10ML IV SOLN
INTRAVENOUS | Status: AC
  Filled 2014-10-11: qty 1

## 2014-10-11 MED ORDER — BUPIVACAINE LIPOSOME 1.3 % IJ SUSP
20.0000 mL | Freq: Once | INTRAMUSCULAR | Status: AC
  Administered 2014-10-11: 20 mL
  Filled 2014-10-11: qty 20

## 2014-10-11 MED ORDER — PANTOPRAZOLE SODIUM 40 MG PO TBEC: 40.0000 mg | DELAYED_RELEASE_TABLET | Freq: Every day | ORAL | Status: DC

## 2014-10-11 MED ORDER — CEFAZOLIN SODIUM 1-5 GM-% IV SOLN
1.0000 g | Freq: Three times a day (TID) | INTRAVENOUS | Status: DC
  Administered 2014-10-11 – 2014-10-12 (×2): 1 g via INTRAVENOUS
  Filled 2014-10-11 (×3): qty 50

## 2014-10-11 MED ORDER — LACTATED RINGERS IV SOLN
INTRAVENOUS | Status: DC
  Administered 2014-10-11: 18:00:00 via INTRAVENOUS

## 2014-10-11 MED ORDER — GELATIN ABSORBABLE MT POWD
OROMUCOSAL | Status: DC | PRN
  Administered 2014-10-11: 10 mL via TOPICAL

## 2014-10-11 MED ORDER — LACTATED RINGERS IV SOLN
INTRAVENOUS | Status: DC
  Administered 2014-10-11: 1000 mL via INTRAVENOUS
  Administered 2014-10-11: 13:00:00 via INTRAVENOUS

## 2014-10-11 MED ORDER — SODIUM CHLORIDE 0.9 % IR SOLN
Status: DC | PRN
  Administered 2014-10-11: 500 mL

## 2014-10-11 MED ORDER — PHENOL 1.4 % MT LIQD: 1.0000 | OROMUCOSAL | Status: DC | PRN

## 2014-10-11 MED ORDER — HYDROMORPHONE HCL 2 MG/ML IJ SOLN
INTRAMUSCULAR | Status: AC
  Filled 2014-10-11: qty 1

## 2014-10-11 MED ORDER — DEXTROSE 5 % IV SOLN
500.0000 mg | Freq: Four times a day (QID) | INTRAVENOUS | Status: DC | PRN
  Administered 2014-10-11: 500 mg via INTRAVENOUS
  Filled 2014-10-11 (×2): qty 5

## 2014-10-11 MED ORDER — OXYCODONE-ACETAMINOPHEN 5-325 MG PO TABS: 1.0000 | ORAL_TABLET | ORAL | Status: DC | PRN

## 2014-10-11 MED ORDER — ONDANSETRON HCL 4 MG/2ML IJ SOLN
INTRAMUSCULAR | Status: DC | PRN
  Administered 2014-10-11: 4 mg via INTRAVENOUS

## 2014-10-11 MED ORDER — BACITRACIN-NEOMYCIN-POLYMYXIN 400-5-5000 EX OINT
TOPICAL_OINTMENT | CUTANEOUS | Status: DC | PRN
  Administered 2014-10-11: 1 via TOPICAL

## 2014-10-11 MED ORDER — THROMBIN 5000 UNITS EX SOLR
CUTANEOUS | Status: AC
  Filled 2014-10-11: qty 10000

## 2014-10-11 MED ORDER — ACETAMINOPHEN 650 MG RE SUPP: 650.0000 mg | RECTAL | Status: DC | PRN

## 2014-10-11 MED ORDER — HYDROMORPHONE HCL 1 MG/ML IJ SOLN
0.2500 mg | INTRAMUSCULAR | Status: DC | PRN
  Administered 2014-10-11 (×2): 0.5 mg via INTRAVENOUS

## 2014-10-11 MED ORDER — MIDAZOLAM HCL 5 MG/5ML IJ SOLN
INTRAMUSCULAR | Status: DC | PRN
  Administered 2014-10-11: 2 mg via INTRAVENOUS

## 2014-10-11 MED ORDER — FENTANYL CITRATE (PF) 250 MCG/5ML IJ SOLN
INTRAMUSCULAR | Status: AC
  Filled 2014-10-11: qty 25

## 2014-10-11 MED ORDER — FLEET ENEMA 7-19 GM/118ML RE ENEM: 1.0000 | ENEMA | Freq: Once | RECTAL | Status: DC | PRN

## 2014-10-11 SURGICAL SUPPLY — 40 items
BAG ZIPLOCK 12X15 (MISCELLANEOUS) IMPLANT
CLEANER TIP ELECTROSURG 2X2 (MISCELLANEOUS) ×3 IMPLANT
DRAIN PENROSE 18X1/4 LTX STRL (WOUND CARE) IMPLANT
DRAPE MICROSCOPE LEICA (MISCELLANEOUS) ×3 IMPLANT
DRAPE POUCH INSTRU U-SHP 10X18 (DRAPES) ×3 IMPLANT
DRAPE SURG 17X11 SM STRL (DRAPES) ×3 IMPLANT
DRSG ADAPTIC 3X8 NADH LF (GAUZE/BANDAGES/DRESSINGS) ×3 IMPLANT
DRSG PAD ABDOMINAL 8X10 ST (GAUZE/BANDAGES/DRESSINGS) ×3 IMPLANT
DURAPREP 26ML APPLICATOR (WOUND CARE) ×3 IMPLANT
ELECT BLADE TIP CTD 4 INCH (ELECTRODE) ×3 IMPLANT
ELECT REM PT RETURN 9FT ADLT (ELECTROSURGICAL) ×3
ELECTRODE REM PT RTRN 9FT ADLT (ELECTROSURGICAL) ×1 IMPLANT
GAUZE SPONGE 4X4 12PLY STRL (GAUZE/BANDAGES/DRESSINGS) ×3 IMPLANT
GLOVE BIO SURGEON STRL SZ7.5 (GLOVE) ×3 IMPLANT
GLOVE BIOGEL PI IND STRL 7.5 (GLOVE) ×1 IMPLANT
GLOVE BIOGEL PI IND STRL 8 (GLOVE) ×2 IMPLANT
GLOVE BIOGEL PI INDICATOR 7.5 (GLOVE) ×2
GLOVE BIOGEL PI INDICATOR 8 (GLOVE) ×4
GLOVE ECLIPSE 8.0 STRL XLNG CF (GLOVE) ×6 IMPLANT
GOWN STRL REUS W/TWL XL LVL3 (GOWN DISPOSABLE) ×9 IMPLANT
KIT BASIN OR (CUSTOM PROCEDURE TRAY) ×3 IMPLANT
KIT POSITIONING SURG ANDREWS (MISCELLANEOUS) ×3 IMPLANT
MANIFOLD NEPTUNE II (INSTRUMENTS) ×3 IMPLANT
NEEDLE SPNL 18GX3.5 QUINCKE PK (NEEDLE) ×6 IMPLANT
PACK LAMINECTOMY ORTHO (CUSTOM PROCEDURE TRAY) ×3 IMPLANT
PATTIES SURGICAL .5 X.5 (GAUZE/BANDAGES/DRESSINGS) ×3 IMPLANT
PATTIES SURGICAL .75X.75 (GAUZE/BANDAGES/DRESSINGS) ×3 IMPLANT
PATTIES SURGICAL 1X1 (DISPOSABLE) ×3 IMPLANT
PIN SAFETY NICK PLATE  2 MED (MISCELLANEOUS)
PIN SAFETY NICK PLATE 2 MED (MISCELLANEOUS) IMPLANT
RUBBERBAND STERILE (MISCELLANEOUS) ×3 IMPLANT
SPONGE LAP 4X18 X RAY DECT (DISPOSABLE) ×6 IMPLANT
SPONGE SURGIFOAM ABS GEL 100 (HEMOSTASIS) ×3 IMPLANT
STAPLER VISISTAT 35W (STAPLE) ×3 IMPLANT
SUT VIC AB 0 CT1 27 (SUTURE) ×2
SUT VIC AB 0 CT1 27XBRD ANTBC (SUTURE) ×1 IMPLANT
SUT VIC AB 1 CT1 27 (SUTURE) ×4
SUT VIC AB 1 CT1 27XBRD ANTBC (SUTURE) ×2 IMPLANT
TAPE CLOTH SURG 6X10 WHT LF (GAUZE/BANDAGES/DRESSINGS) ×3 IMPLANT
TOWEL OR 17X26 10 PK STRL BLUE (TOWEL DISPOSABLE) ×3 IMPLANT

## 2014-10-11 NOTE — H&P (Signed)
Brian Case is an 39 y.o. male.   Chief Complaint: Back and left leg pain HPI: Progressive pain  And numbness in his Left Leg.  Past Medical History  Diagnosis Date  . GERD (gastroesophageal reflux disease)     Past Surgical History  Procedure Laterality Date  . Right knee arthroscopy     . Vasectomy      History reviewed. No pertinent family history. Social History:  reports that he has never smoked. He has never used smokeless tobacco. He reports that he drinks alcohol. He reports that he does not use illicit drugs.  Allergies: No Known Allergies  Medications Prior to Admission  Medication Sig Dispense Refill  . acetaminophen (TYLENOL) 500 MG tablet Take 2,000 mg by mouth every 6 (six) hours as needed for moderate pain.    . predniSONE (DELTASONE) 50 MG tablet Take 50 mg by mouth every morning.    . pantoprazole (PROTONIX) 20 MG tablet Take 2 tablets (40 mg total) by mouth daily. (Patient not taking: Reported on 10/09/2014) 30 tablet 0    Results for orders placed or performed during the hospital encounter of 10/09/14 (from the past 48 hour(s))  Surgical pcr screen     Status: None   Collection Time: 10/09/14 11:50 AM  Result Value Ref Range   MRSA, PCR NEGATIVE NEGATIVE   Staphylococcus aureus NEGATIVE NEGATIVE    Comment:        The Xpert SA Assay (FDA approved for NASAL specimens in patients over 28 years of age), is one component of a comprehensive surveillance program.  Test performance has been validated by [facility] for patients greater than or equal to 1 year old. It is not intended to diagnose infection nor to guide or monitor treatment.   CBC WITH DIFFERENTIAL     Status: Abnormal   Collection Time: 10/09/14 12:05 PM  Result Value Ref Range   WBC 6.3 4.0 - 10.5 K/uL   RBC 5.44 4.22 - 5.81 MIL/uL   Hemoglobin 16.5 13.0 - 17.0 g/dL   HCT 48.4 39.0 - 52.0 %   MCV 89.0 78.0 - 100.0 fL   MCH 30.3 26.0 - 34.0 pg   MCHC 34.1 30.0 - 36.0 g/dL   RDW  12.5 11.5 - 15.5 %   Platelets 271 150 - 400 K/uL   Neutrophils Relative % 85 (H) 43 - 77 %   Neutro Abs 5.3 1.7 - 7.7 K/uL   Lymphocytes Relative 13 12 - 46 %   Lymphs Abs 0.8 0.7 - 4.0 K/uL   Monocytes Relative 2 (L) 3 - 12 %   Monocytes Absolute 0.1 0.1 - 1.0 K/uL   Eosinophils Relative 0 0 - 5 %   Eosinophils Absolute 0.0 0.0 - 0.7 K/uL   Basophils Relative 0 0 - 1 %   Basophils Absolute 0.0 0.0 - 0.1 K/uL  Comprehensive metabolic panel     Status: Abnormal   Collection Time: 10/09/14 12:05 PM  Result Value Ref Range   Sodium 138 135 - 145 mmol/L   Potassium 4.1 3.5 - 5.1 mmol/L   Chloride 102 101 - 111 mmol/L   CO2 26 22 - 32 mmol/L   Glucose, Bld 123 (H) 65 - 99 mg/dL   BUN 13 6 - 20 mg/dL   Creatinine, Ser 0.84 0.61 - 1.24 mg/dL   Calcium 10.0 8.9 - 10.3 mg/dL   Total Protein 7.8 6.5 - 8.1 g/dL   Albumin 4.8 3.5 - 5.0 g/dL   AST  27 15 - 41 U/L   ALT 29 17 - 63 U/L   Alkaline Phosphatase 73 38 - 126 U/L   Total Bilirubin 0.6 0.3 - 1.2 mg/dL   GFR calc non Af Amer >60 >60 mL/min   GFR calc Af Amer >60 >60 mL/min    Comment: (NOTE) The eGFR has been calculated using the CKD EPI equation. This calculation has not been validated in all clinical situations. eGFR's persistently <60 mL/min signify possible Chronic Kidney Disease.    Anion gap 10 5 - 15  Protime-INR     Status: None   Collection Time: 10/09/14 12:05 PM  Result Value Ref Range   Prothrombin Time 12.8 11.6 - 15.2 seconds   INR 0.94 0.00 - 1.49   Dg Chest 2 View  10/09/2014   CLINICAL DATA:  Preoperative examination prior to lumbar surgery planned for October 11, 2014, no known cardiopulmonary abnormality  EXAM: CHEST  2 VIEW  COMPARISON:  PA and lateral chest x-ray of September 04, 2013  FINDINGS: The lungs are well-expanded. There is subtle nodularity that projects in the posterior costophrenic gutter on the lateral view only. This is similar to that seen on the previous examination. The heart and pulmonary  vascularity are normal. The mediastinum is normal in width. There is no pleural effusion. There is stable curvature convex toward the left centered at approximately T5.  IMPRESSION: There is no acute cardiopulmonary abnormality.   Electronically Signed   By: David  Martinique M.D.   On: 10/09/2014 12:29   Dg Lumbar Spine 2-3 Views  10/09/2014   CLINICAL DATA:  Preoperative exam prior to micro discectomy and laminectomy at L5-S1 scheduled for October 11, 2014  EXAM: LUMBAR SPINE - 2-3 VIEW  COMPARISON:  Chest x-ray of today's date for purposes of vertebral level numbering. Comparison also with lateral chest x-ray dated September 04, 2013.  FINDINGS: There are 5 lumbar type non-rib-bearing vertebral bodies present. The pedicles and transverse processes are intact. There is mild anterior wedging of L1. There is mild disc space narrowing at L1-2 and at L4-5. There is facet joint hypertrophy from L3 through S1.  IMPRESSION: There is mild degenerative disc space narrowing at L1-2 and L4-5 with multilevel facet joint hypertrophy. There is chronic mild less than 10% anterior wedge compression of L1   Electronically Signed   By: David  Martinique M.D.   On: 10/09/2014 12:33    Review of Systems  Constitutional: Negative.   HENT: Negative.   Eyes: Negative.   Respiratory: Negative.   Cardiovascular: Negative.   Gastrointestinal: Negative.   Genitourinary: Negative.   Musculoskeletal: Positive for back pain.  Skin: Negative.   Neurological: Positive for sensory change.  Endo/Heme/Allergies: Negative.   Psychiatric/Behavioral: Negative.     Blood pressure 130/76, pulse 74, temperature 98.1 F (36.7 C), temperature source Oral, resp. rate 18, height $RemoveBe'6\' 2"'ByNANfHmm$  (1.88 m), weight 91.808 kg (202 lb 6.4 oz), SpO2 99 %. Physical Exam  Constitutional: He appears well-developed.  HENT:  Head: Normocephalic.  Eyes: Pupils are equal, round, and reactive to light.  Neck: Normal range of motion.  Cardiovascular: Normal rate.    Respiratory: Effort normal.  GI: Soft.  Musculoskeletal:  Positive straight leg raising on the left with minimal weakness of Plantar Flexors on the left     Assessment/Plan Laminectomy and Microdiscectomy at L-5-S-1and decompression of Left lateral Recess,for stenosis.  Morgan Rennert A 10/11/2014, 11:11 AM

## 2014-10-11 NOTE — Plan of Care (Signed)
Problem: Phase I Progression Outcomes Goal: Initial discharge plan identified Outcome: Completed/Met Date Met:  10/11/14 Pt plans to go home with wife at discharge.

## 2014-10-11 NOTE — Interval H&P Note (Signed)
History and Physical Interval Note:  10/11/2014 11:16 AM  Brian Case  has presented today for surgery, with the diagnosis of RUPTURED DISC  The various methods of treatment have been discussed with the patient and family. After consideration of risks, benefits and other options for treatment, the patient has consented to  Procedure(s): HEMI-MICRODISCECTOMY LAMINECTOMY L5-S1 (N/A) on the Left  as a surgical intervention .  The patient's history has been reviewed, patient examined, no change in status, stable for surgery.  I have reviewed the patient's chart and labs.  Questions were answered to the patient's satisfaction.     [name]

## 2014-10-11 NOTE — Transfer of Care (Signed)
Immediate Anesthesia Transfer of Care Note  Patient: Brian Case  Procedure(s) Performed: Procedure(s): CENTRAL DECOMPRESSION FOR STENOSIS, LAMINECTOMY, MICRODISCECTOMY LUMBAR FIVE TO SACRAL ONE LEFT (Left)  Patient Location: PACU  Anesthesia Type:General  Level of Consciousness: awake and alert   Airway & Oxygen Therapy: Patient Spontanous Breathing and Patient connected to face mask oxygen  Post-op Assessment: Report given to RN and Post -op Vital signs reviewed and stable  Post vital signs: Reviewed and stable  Last Vitals:  Filed Vitals:   10/11/14 0907  BP: 130/76  Pulse: 74  Temp: 36.7 C  Resp: 18    Complications: No apparent anesthesia complications

## 2014-10-11 NOTE — Anesthesia Preprocedure Evaluation (Signed)
Anesthesia Evaluation  Patient identified by MRN, date of birth, ID band Patient awake    Reviewed: Allergy & Precautions, NPO status , Patient's Chart, lab work & pertinent test results  Airway Mallampati: II  TM Distance: >3 FB Neck ROM: Full    Dental no notable dental hx.    Pulmonary neg pulmonary ROS,  breath sounds clear to auscultation  Pulmonary exam normal       Cardiovascular negative cardio ROS Normal cardiovascular examRhythm:Regular Rate:Normal     Neuro/Psych negative neurological ROS  negative psych ROS   GI/Hepatic Neg liver ROS, GERD-  Medicated,  Endo/Other  negative endocrine ROS  Renal/GU negative Renal ROS  negative genitourinary   Musculoskeletal negative musculoskeletal ROS (+)   Abdominal   Peds negative pediatric ROS (+)  Hematology negative hematology ROS (+)   Anesthesia Other Findings   Reproductive/Obstetrics negative OB ROS                             Anesthesia Physical Anesthesia Plan  ASA: II  Anesthesia Plan: General   Post-op Pain Management:    Induction: Intravenous  Airway Management Planned: Oral ETT  Additional Equipment:   Intra-op Plan:   Post-operative Plan: Extubation in OR  Informed Consent: I have reviewed the patients History and Physical, chart, labs and discussed the procedure including the risks, benefits and alternatives for the proposed anesthesia with the patient or authorized representative who has indicated his/her understanding and acceptance.   Dental advisory given  Plan Discussed with: CRNA and Surgeon  Anesthesia Plan Comments:         Anesthesia Quick Evaluation

## 2014-10-11 NOTE — Anesthesia Postprocedure Evaluation (Signed)
  Anesthesia Post-op Note  Patient: Brian Case  Procedure(s) Performed: Procedure(s) (LRB): CENTRAL DECOMPRESSION FOR STENOSIS, LAMINECTOMY, MICRODISCECTOMY LUMBAR FIVE TO SACRAL ONE LEFT (Left)  Patient Location: PACU  Anesthesia Type: General  Level of Consciousness: awake and alert   Airway and Oxygen Therapy: Patient Spontanous Breathing  Post-op Pain: mild  Post-op Assessment: Post-op Vital signs reviewed, Patient's Cardiovascular Status Stable, Respiratory Function Stable, Patent Airway and No signs of Nausea or vomiting  Last Vitals:  Filed Vitals:   10/11/14 1400  BP: 144/78  Pulse: 88  Temp:   Resp: 16    Post-op Vital Signs: stable   Complications: No apparent anesthesia complications
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
## 2014-10-11 NOTE — Anesthesia Procedure Notes (Signed)
Procedure Name: Intubation Date/Time: 10/11/2014 11:27 AM Performed by: Izora Gala A Pre-anesthesia Checklist: Emergency Drugs available, Patient identified, Timeout performed, Suction available and Patient being monitored Patient Re-evaluated:Patient Re-evaluated prior to inductionOxygen Delivery Method: Circle system utilized Preoxygenation: Pre-oxygenation with 100% oxygen Intubation Type: IV induction Laryngoscope Size: Mac and 4 Grade View: Grade I Tube type: Oral Tube size: 8.0 mm Number of attempts: 1 Airway Equipment and Method: Stylet Placement Confirmation: ETT inserted through vocal cords under direct vision,  breath sounds checked- equal and bilateral and positive ETCO2 Secured at: 22 cm Tube secured with: Tape Dental Injury: Teeth and Oropharynx as per pre-operative assessment

## 2014-10-11 NOTE — Brief Op Note (Signed)
10/11/2014  1:28 PM  PATIENT:  Brian Case  39 y.o. 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    SPECIMEN:  Source of Specimen:  L-5-S-1  DISPOSITION OF SPECIMEN:  PATHOLOGY  COUNTS:  YES  TOURNIQUET:  * No tourniquets in log *  DICTATION: .Other Dictation: Dictation Number [id number]  PLAN OF CARE: Admit for overnight observation  PATIENT DISPOSITION:  PACU - hemodynamically stable.   Delay start of Pharmacological VTE agent (>24hrs) due to surgical blood loss or risk of bleeding: yes

## 2014-10-12 ENCOUNTER — Encounter (HOSPITAL_COMMUNITY): Payer: Self-pay | Admitting: Orthopedic Surgery

## 2014-10-12 DIAGNOSIS — M4807 Spinal stenosis, lumbosacral region: Secondary | ICD-10-CM | POA: Diagnosis not present

## 2014-10-12 NOTE — Op Note (Signed)
Brian Case, Brian Case              ACCOUNT NO.:  [id number]  MEDICAL RECORD NO.:  [id number]  LOCATION:  1617                         FACILITY:  [facility]  PHYSICIAN:  Georges Lynch. Leinaala Catanese, M.D.DATE OF BIRTH:  September 24, 1975  DATE OF PROCEDURE:  10/11/2014 DATE OF DISCHARGE:                              OPERATIVE REPORT   SURGEON:  Georges Lynch. Darrelyn Hillock, M.D.  ASSISTANT:  Marlowe Kays, M.D.  OPERATION: 1. Central decompressive lumbar laminectomy for spinal stenosis at L5-     S1. 2. Microdiskectomy at L5-S1 on the left.  Note, he had 3 large     fragments extruded.  PREOPERATIVE DIAGNOSES: 1. Spinal stenosis at L5-S1. 2. Herniated lumbar disk at L5-S1 on the left.  POSTOPERATIVE DIAGNOSES: 1. Spinal stenosis at L5-S1. 2. Herniated lumbar disk at L5-S1 on the left.  DESCRIPTION OF PROCEDURE:  Under general anesthesia, routine orthopedic prep and draping of the lower back was carried out.  I marked the appropriate left side of the back in the holding area and appropriate time-out was carried out.  Two needles were placed in the back for localization purposes and x-ray was taken.  At this particular time, an incision was made over the L5-S1 space after the patient had 2 g of IV Ancef.  Following that, the muscle was separated from the lamina spinous process bilaterally.  Self-retaining retractors were inserted.  Prior to making the incision, I injected 20 mL of 0.5% Marcaine with epinephrine into the soft tissue to control bleeding.  At this time, we then took another x-ray to verify the position.  We then went down centrally because of the large disk.  We did a central decompression at L5-S1.  We identified the dura, gently retracted the dura, brought the microscope in.  We went over and decompressed the lateral recess.  Note, we did a nice foraminotomy for the S1 root and the L5 root above.  Following that, we took a great deal care to protect the dura and the S1 root and we gently  retracted that and cauterized lateral recess vein.  A needle then was placed down into the L5-S1 area.  Following that, we then utilized the D'Errico retractors and gently then used a nerve hook and dissected out directly 3 large fragments of disk material.  We then into the axilla of the root above and below the root.  We went into the foramen for the 5 root and 4 root now and the S1 root now, and now they were clear, we were able to easily move the root about without any issues then the dura.  We went back, searched once again for any other fragments, there were none.  We thoroughly irrigated out the area, loosely applied thrombin-soaked Gelfoam and closed the wound layers in usual fashion.  I left a small distal deep and proximal part of the wound open for drainage purposes.  At the end of the procedure, I injected 20 mL of Exparel into the muscle and subcu tissue.  The subcu was closed with #1 Vicryl, skin with metal staples, and sterile dressings were applied.          ______________________________ Georges Lynch Darrelyn Hillock, M.D.  RAG/MEDQ  D:  10/11/2014  T:  10/12/2014  Job:  [id number]

## 2014-10-12 NOTE — Evaluation (Signed)
Occupational Therapy Evaluation Patient Details Name: Brian Case MRN: [id number] DOB: 04-13-1975 Today's Date: 10/12/2014    History of Present Illness 39 yo male s/p central decompressive laminectomy L5-S1, microdiscectomy L5-S1.    Clinical Impression   Patient evaluated by Occupational Therapy with no further acute OT needs identified. All education has been completed and the patient has no further questions. All education completed. See below for any follow-up Occupational Therapy or equipment needs. OT is signing off. Thank you for this referral.      Follow Up Recommendations  No OT follow up;Supervision/Assistance - 24 hour    Equipment Recommendations  None recommended by OT    Recommendations for Other Services       Precautions / Restrictions Precautions Precautions: Back Precaution Booklet Issued: Yes (comment) Precaution Comments: Pt able to state 3/3 back precautions independently, and demonstrates understanding.  Handout provided  Restrictions Weight Bearing Restrictions: No      Mobility Bed Mobility Overal bed mobility: Modified Independent             General bed mobility comments: Pt performed logroll technique  Transfers Overall transfer level: Modified independent                    Balance Overall balance assessment: No apparent balance deficits (not formally assessed)                                          ADL Overall ADL's : Modified independent                                       General ADL Comments: reviewed safe techniques for LB bathing and dressing, toilet transfers, childcare, grooming, and IADLs      Vision     Perception     Praxis      Pertinent Vitals/Pain Pain Assessment: 0-10 Pain Score: 2  Pain Location: back  Pain Descriptors / Indicators: Discomfort Pain Intervention(s): Monitored during session     Hand Dominance     Extremity/Trunk Assessment Upper  Extremity Assessment Upper Extremity Assessment: Overall WFL for tasks assessed   Lower Extremity Assessment Lower Extremity Assessment: Defer to PT evaluation LLE Sensation: decreased light touch   Cervical / Trunk Assessment Cervical / Trunk Assessment: Normal   Communication Communication Communication: No difficulties   Cognition Arousal/Alertness: Awake/alert Behavior During Therapy: WFL for tasks assessed/performed Overall Cognitive Status: Within Functional Limits for tasks assessed                     General Comments       Exercises       Shoulder Instructions      Home Living Family/patient expects to be discharged to:: Private residence Living Arrangements: Spouse/significant other Available Help at Discharge: Family Type of Home: House Home Access: Stairs to enter Secretary/administrator of Steps: 3 Entrance Stairs-Rails: None Home Layout: One level     Bathroom Shower/Tub: Producer, television/film/video: Standard Bathroom Accessibility: Yes   Home Equipment: None   Additional Comments: Pt has 6 mos old, 39 y.o. and 37 y.o chldren       Prior Functioning/Environment Level of Independence: Independent             OT Diagnosis: Generalized weakness;Acute pain  OT Problem List:     OT Treatment/Interventions:      OT Goals(Current goals can be found in the care plan section) Acute Rehab OT Goals Patient Stated Goal: home OT Goal Formulation: All assessment and education complete, DC therapy  OT Frequency:     Barriers to D/C:            Co-evaluation              End of Session Equipment Utilized During Treatment: Rolling walker Nurse Communication: Mobility status  Activity Tolerance: Patient tolerated treatment well Patient left: in bed;with call bell/phone within reach;with family/visitor present;with nursing/sitter in room   Time: 1047-1107 OT Time Calculation (min): 20 min Charges:  OT General Charges $OT  Visit: 1 Procedure OT Evaluation $Initial OT Evaluation Tier I: 1 Procedure G-Codes: OT G-codes **NOT FOR INPATIENT CLASS** Functional Limitation: Self care Self Care Current Status (Z6109): At least 1 percent but less than 20 percent impaired, limited or restricted Self Care Goal Status (U0454): At least 1 percent but less than 20 percent impaired, limited or restricted Self Care Discharge Status [phone]): At least 1 percent but less than 20 percent impaired, limited or restricted  Steven Veazie M 10/12/2014, 12:46 PM

## 2014-10-12 NOTE — Evaluation (Signed)
Physical Therapy Evaluation Patient Details Name: Brian Case MRN: [id number] DOB: 1975/09/27 Today's Date: 10/12/2014   History of Present Illness  39 yo male s/p central decompressive laminectomy L5-S1, microdiscectomy L5-S1.   Clinical Impression  On eval, pt was Mod Ind with mobility-walked ~200 feet and climbed 3 steps without difficulty. All education completed. Ready to d/c from PT standpoint.     Follow Up Recommendations No PT follow up    Equipment Recommendations  None recommended by PT    Recommendations for Other Services       Precautions / Restrictions Precautions Precautions: Back Precaution Comments: Pt recalled 3/3 back precautions Restrictions Weight Bearing Restrictions: No      Mobility  Bed Mobility Overal bed mobility: Modified Independent             General bed mobility comments: Pt performed logroll technique  Transfers Overall transfer level: Modified independent                  Ambulation/Gait Ambulation/Gait assistance: Modified independent (Device/Increase time) Ambulation Distance (Feet): 200 Feet Assistive device: None Gait Pattern/deviations: WFL(Within Functional Limits)        Stairs Stairs: Yes Stairs assistance: Modified independent (Device/Increase time) Stair Management: Alternating pattern Number of Stairs: 3 General stair comments: VCs safety  Wheelchair Mobility    Modified Rankin (Stroke Patients Only)       Balance                                             Pertinent Vitals/Pain Pain Assessment: 0-10 Pain Score: 3  Pain Location: back Pain Descriptors / Indicators: Sore Pain Intervention(s): Monitored during session    Home Living Family/patient expects to be discharged to:: Private residence Living Arrangements: Spouse/significant other Available Help at Discharge: Family Type of Home: House Home Access: Stairs to enter Entrance Stairs-Rails: None Water quality scientist of Steps: 3 Home Layout: One level Home Equipment: None      Prior Function Level of Independence: Independent               Hand Dominance        Extremity/Trunk Assessment   Upper Extremity Assessment: Defer to OT evaluation           Lower Extremity Assessment: LLE deficits/detail      Cervical / Trunk Assessment: Normal  Communication   Communication: No difficulties  Cognition Arousal/Alertness: Awake/alert Behavior During Therapy: WFL for tasks assessed/performed Overall Cognitive Status: Within Functional Limits for tasks assessed                      General Comments      Exercises        Assessment/Plan    PT Assessment Patent does not need any further PT services  PT Diagnosis     PT Problem List    PT Treatment Interventions     PT Goals (Current goals can be found in the Care Plan section) Acute Rehab PT Goals Patient Stated Goal: home PT Goal Formulation: All assessment and education complete, DC therapy    Frequency     Barriers to discharge        Co-evaluation               End of Session   Activity Tolerance: Patient tolerated treatment well Patient left: in bed;with family/visitor present;with call  bell/phone within reach      Functional Assessment Tool Used: clinical judgement Functional Limitation: Mobility: Walking and moving around Mobility: Walking and Moving Around Current Status [phone]): 0 percent impaired, limited or restricted Mobility: Walking and Moving Around Goal Status [phone]): 0 percent impaired, limited or restricted Mobility: Walking and Moving Around Discharge Status [phone]): 0 percent impaired, limited or restricted    Time: 1024-1032 PT Time Calculation (min) (ACUTE ONLY): 8 min   Charges:   PT Evaluation $Initial PT Evaluation Tier I: 1 Procedure     PT G Codes:   PT G-Codes **NOT FOR INPATIENT CLASS** Functional Assessment Tool Used: clinical  judgement Functional Limitation: Mobility: Walking and moving around Mobility: Walking and Moving Around Current Status (O1308): 0 percent impaired, limited or restricted Mobility: Walking and Moving Around Goal Status (M5784): 0 percent impaired, limited or restricted Mobility: Walking and Moving Around Discharge Status (O9629): 0 percent impaired, limited or restricted    Rebeca Alert, MPT Pager: [phone]

## 2014-10-12 NOTE — Discharge Instructions (Signed)
Change your dressing daily. Shower only, no tub bath. Call if any temperatures greater than 101 or any wound complications: [phone] during the day and ask for Dr. Jeannetta Ellis nurse, Mackey Birchwood. Take aspirin  daily to prevent blood clots

## 2014-10-12 NOTE — Progress Notes (Signed)
Subjective: 1 Day Post-Op Procedure(s) (LRB): CENTRAL DECOMPRESSION FOR STENOSIS, LAMINECTOMY, MICRODISCECTOMY LUMBAR FIVE TO SACRAL ONE LEFT (Left) Patient reports pain as 1 on 0-10 scale. Doing very well. Less numbness in his Left Leg.   Objective: Vital signs in last 24 hours: Temp:  [97.9 F (36.6 C)-99.1 F (37.3 C)] 98.4 F (36.9 C) (08/18 0607) Pulse Rate:  [55-95] 58 (08/18 0607) Resp:  [12-18] 16 (08/18 0607) BP: (116-149)/(61-78) 123/67 mmHg (08/18 0607) SpO2:  [94 %-100 %] 95 % (08/18 0607) Weight:  [91.627 kg (202 lb)-91.808 kg (202 lb 6.4 oz)] 91.627 kg (202 lb) (08/17 1538)  Intake/Output from previous day: 08/17 0701 - 08/18 0700 In: 4475 [P.O.:1920; I.V.:2500; IV Piggyback:55] Out: 4600 [Urine:4550; Blood:50] Intake/Output this shift:     Recent Labs  10/09/14 1205  HGB 16.5    Recent Labs  10/09/14 1205  WBC 6.3  RBC 5.44  HCT 48.4  PLT 271    Recent Labs  10/09/14 1205  NA 138  K 4.1  CL 102  CO2 26  BUN 13  CREATININE 0.84  GLUCOSE 123*  CALCIUM 10.0    Recent Labs  10/09/14 1205  INR 0.94    Neurologically intact Dorsiflexion/Plantar flexion intact  Assessment/Plan: 1 Day Post-Op Procedure(s) (LRB): CENTRAL DECOMPRESSION FOR STENOSIS, LAMINECTOMY, MICRODISCECTOMY LUMBAR FIVE TO SACRAL ONE LEFT (Left) Up with therapy Discharge home with home health.  Aanshi Batchelder A 10/12/2014, 7:21 AM

## 2014-10-15 NOTE — Discharge Summary (Signed)
Physician Discharge Summary   Patient ID: Brian Case MRN: [id number] DOB/AGE: 01-Jun-1975 39 y.o.  Admit date: 10/11/2014 Discharge date: 10/12/2014  Primary Diagnosis: Lumbar disc herniation/spinal stenosis L5-S1   Admission Diagnoses:  Past Medical History  Diagnosis Date  . GERD (gastroesophageal reflux disease)    Discharge Diagnoses:   Active Problems:   Spinal stenosis, lumbar region, with neurogenic claudication  Estimated body mass index is 25.92 kg/(m^2) as calculated from the following:   Height as of this encounter: 6' 2" (1.88 m).   Weight as of this encounter: 91.627 kg (202 lb).  Procedure:  Procedure(s) (LRB): CENTRAL DECOMPRESSION FOR STENOSIS, LAMINECTOMY, MICRODISCECTOMY LUMBAR FIVE TO SACRAL ONE LEFT (Left)   Consults: None  HPI: The patient presented with the chief complaint of low back pain with progressive pain and weakness of the left leg.   Laboratory Data: Hospital Outpatient Visit on 10/09/2014  Component Date Value Ref Range Status  . WBC 10/09/2014 6.3  4.0 - 10.5 K/uL Final  . RBC 10/09/2014 5.44  4.22 - 5.81 MIL/uL Final  . Hemoglobin 10/09/2014 16.5  13.0 - 17.0 g/dL Final  . HCT 10/09/2014 48.4  39.0 - 52.0 % Final  . MCV 10/09/2014 89.0  78.0 - 100.0 fL Final  . MCH 10/09/2014 30.3  26.0 - 34.0 pg Final  . MCHC 10/09/2014 34.1  30.0 - 36.0 g/dL Final  . RDW 10/09/2014 12.5  11.5 - 15.5 % Final  . Platelets 10/09/2014 271  150 - 400 K/uL Final  . Neutrophils Relative % 10/09/2014 85* 43 - 77 % Final  . Neutro Abs 10/09/2014 5.3  1.7 - 7.7 K/uL Final  . Lymphocytes Relative 10/09/2014 13  12 - 46 % Final  . Lymphs Abs 10/09/2014 0.8  0.7 - 4.0 K/uL Final  . Monocytes Relative 10/09/2014 2* 3 - 12 % Final  . Monocytes Absolute 10/09/2014 0.1  0.1 - 1.0 K/uL Final  . Eosinophils Relative 10/09/2014 0  0 - 5 % Final  . Eosinophils Absolute 10/09/2014 0.0  0.0 - 0.7 K/uL Final  . Basophils Relative 10/09/2014 0  0 - 1 % Final  .  Basophils Absolute 10/09/2014 0.0  0.0 - 0.1 K/uL Final  . Sodium 10/09/2014 138  135 - 145 mmol/L Final  . Potassium 10/09/2014 4.1  3.5 - 5.1 mmol/L Final  . Chloride 10/09/2014 102  101 - 111 mmol/L Final  . CO2 10/09/2014 26  22 - 32 mmol/L Final  . Glucose, Bld 10/09/2014 123* 65 - 99 mg/dL Final  . BUN 10/09/2014 13  6 - 20 mg/dL Final  . Creatinine, Ser 10/09/2014 0.84  0.61 - 1.24 mg/dL Final  . Calcium 10/09/2014 10.0  8.9 - 10.3 mg/dL Final  . Total Protein 10/09/2014 7.8  6.5 - 8.1 g/dL Final  . Albumin 10/09/2014 4.8  3.5 - 5.0 g/dL Final  . AST 10/09/2014 27  15 - 41 U/L Final  . ALT 10/09/2014 29  17 - 63 U/L Final  . Alkaline Phosphatase 10/09/2014 73  38 - 126 U/L Final  . Total Bilirubin 10/09/2014 0.6  0.3 - 1.2 mg/dL Final  . GFR calc non Af Amer 10/09/2014 >60  >60 mL/min Final  . GFR calc Af Amer 10/09/2014 >60  >60 mL/min Final   Comment: (NOTE) The eGFR has been calculated using the CKD EPI equation. This calculation has not been validated in all clinical situations. eGFR's persistently <60 mL/min signify possible Chronic Kidney Disease.   . Anion gap  10/09/2014 10  5 - 15 Final  . Prothrombin Time 10/09/2014 12.8  11.6 - 15.2 seconds Final  . INR 10/09/2014 0.94  0.00 - 1.49 Final  . MRSA, PCR 10/09/2014 NEGATIVE  NEGATIVE Final  . Staphylococcus aureus 10/09/2014 NEGATIVE  NEGATIVE Final   Comment:        The Xpert SA Assay (FDA approved for NASAL specimens in patients over 21 years of age), is one component of a comprehensive surveillance program.  Test performance has been validated by [facility] for patients greater than or equal to 1 year old. It is not intended to diagnose infection nor to guide or monitor treatment.      X-Rays:Dg Chest 2 View  10/09/2014   CLINICAL DATA:  Preoperative examination prior to lumbar surgery planned for October 11, 2014, no known cardiopulmonary abnormality  EXAM: CHEST  2 VIEW  COMPARISON:  PA and lateral chest  x-ray of September 04, 2013  FINDINGS: The lungs are well-expanded. There is subtle nodularity that projects in the posterior costophrenic gutter on the lateral view only. This is similar to that seen on the previous examination. The heart and pulmonary vascularity are normal. The mediastinum is normal in width. There is no pleural effusion. There is stable curvature convex toward the left centered at approximately T5.  IMPRESSION: There is no acute cardiopulmonary abnormality.   Electronically Signed   By: David  Jordan M.D.   On: 10/09/2014 12:29   Dg Lumbar Spine 2-3 Views  10/09/2014   CLINICAL DATA:  Preoperative exam prior to micro discectomy and laminectomy at L5-S1 scheduled for October 11, 2014  EXAM: LUMBAR SPINE - 2-3 VIEW  COMPARISON:  Chest x-ray of today's date for purposes of vertebral level numbering. Comparison also with lateral chest x-ray dated September 04, 2013.  FINDINGS: There are 5 lumbar type non-rib-bearing vertebral bodies present. The pedicles and transverse processes are intact. There is mild anterior wedging of L1. There is mild disc space narrowing at L1-2 and at L4-5. There is facet joint hypertrophy from L3 through S1.  IMPRESSION: There is mild degenerative disc space narrowing at L1-2 and L4-5 with multilevel facet joint hypertrophy. There is chronic mild less than 10% anterior wedge compression of L1   Electronically Signed   By: David  Jordan M.D.   On: 10/09/2014 12:33   Dg Spine Portable 1 View  10/11/2014   CLINICAL DATA:  Intraoperative localization film in patient for L5-S1 decompression.  EXAM: PORTABLE SPINE - 1 VIEW  COMPARISON:  Intraoperative localization film earlier today.  FINDINGS: A probe is now identified subjacent to the L5-S1 lamina with its tip at the level of the inferior aspect of L5. Clamps on the L5 and S1 spinous processes are noted.  IMPRESSION: Localization as above.   Electronically Signed   By: Thomas  Dalessio M.D.   On: 10/11/2014 13:07   Dg Spine  Portable 1 View  10/11/2014   CLINICAL DATA:  L5-S1 level.  EXAM: PORTABLE SPINE - 1 VIEW  COMPARISON:  10/09/2014  FINDINGS: Two posterior surgical instruments are directed at the L5-S1 level.  IMPRESSION: Intraoperative localization of L5-S1.   Electronically Signed   By: Tiandre  Dover M.D.   On: 10/11/2014 12:49   Dg Spine Portable 1 View  10/11/2014   CLINICAL DATA:  L5-S1 laminectomy. Intraoperative localization film.  EXAM: PORTABLE SPINE - 1 VIEW  COMPARISON:  Single view of the lumbar spine 10/11/2014 at 11:37 a.m.  FINDINGS: Single metallic probe is now   identified with its tip at the level of the L5-S1 interspace. Clamps on the L5 and S1 spinous processes are noted.  IMPRESSION: L5-S1 localization.   Electronically Signed   By: Inge Rise M.D.   On: 10/11/2014 12:15   Dg Spine Portable 1 View  10/11/2014   CLINICAL DATA:  Surgical level L5-S1  EXAM: PORTABLE SPINE - 1 VIEW  COMPARISON:  10/09/2014  FINDINGS: Two posterior needles are in place a directed at the L5 level and in the L4-5 interspace.  IMPRESSION: Intraoperative localization as above.   Electronically Signed   By: Rolm Baptise M.D.   On: 10/11/2014 11:51    EKG: Orders placed or performed during the hospital encounter of 10/09/14  . EKG  . EKG     Hospital Course: Ladarrian Asencio is a 39 y.o. who was admitted to [facility] They were brought to the operating room on 10/11/2014 and underwent Procedure(s): [place], LAMINECTOMY, MICRODISCECTOMY LUMBAR FIVE TO SACRAL ONE LEFT.  Patient tolerated the procedure well and was later transferred to the recovery room and then to the orthopaedic floor for postoperative care.  They were given PO and IV analgesics for pain control following their surgery.  They were given 24 hours of postoperative antibiotics of  Anti-infectives    Start     Dose/Rate Route Frequency Ordered Stop   10/11/14 2000  ceFAZolin (ANCEF) IVPB 1 g/50 mL premix  Status:   Discontinued     1 g 100 mL/hr over 30 Minutes Intravenous 3 times per day 10/11/14 1551 10/12/14 1451   10/11/14 1218  polymyxin B 500,000 Units, bacitracin 50,000 Units in sodium chloride irrigation 0.9 % 500 mL irrigation  Status:  Discontinued       As needed 10/11/14 1219 10/11/14 1338   10/11/14 0907  ceFAZolin (ANCEF) IVPB 2 g/50 mL premix     2 g 100 mL/hr over 30 Minutes Intravenous On call to O.R. 10/11/14 9629 10/11/14 1139     and started on DVT prophylaxis in the form of Aspirin.   PT and OT were ordered.  Discharge planning consulted to help with postop disposition and equipment needs.  Patient had a fair night on the evening of surgery.  They started to get up OOB with therapy on day one.  Dressing was changed and the incision was clean and dry.  Patient was seen in rounds and was ready to go home.   Diet: Regular diet Activity:WBAT Follow-up:in 2 weeks Disposition - Home Discharged Condition: stable   Discharge Instructions    Call MD / Call 911    Complete by:  As directed   If you experience chest pain or shortness of breath, CALL 911 and be transported to the hospital emergency room.  If you develope a fever above 101 F, pus (white drainage) or increased drainage or redness at the wound, or calf pain, call your surgeon's office.     Constipation Prevention    Complete by:  As directed   Drink plenty of fluids.  Prune juice may be helpful.  You may use a stool softener, such as Colace (over the counter) 100 mg twice a day.  Use MiraLax (over the counter) for constipation as needed.     Diet general    Complete by:  As directed      Discharge instructions    Complete by:  As directed   Change your dressing daily. Shower only, no tub bath. Call if any  temperatures greater than 101 or any wound complications: [phone] during the day and ask for Dr. Charlestine Night nurse, Brunilda Payor. Take aspirin 320m daily to prevent blood clots     Increase activity slowly as tolerated     Complete by:  As directed             Medication List    STOP taking these medications        pantoprazole 20 MG tablet  Commonly known as:  PROTONIX      TAKE these medications        acetaminophen 500 MG tablet  Commonly known as:  TYLENOL  Take 2,000 mg by mouth every 6 (six) hours as needed for moderate pain.     predniSONE 50 MG tablet  Commonly known as:  DELTASONE  Take 50 mg by mouth every morning.           Follow-up Information    Follow up with GIOFFRE,RONALD A, MD. Schedule an appointment as soon as possible for a visit in 2 weeks.   Specialty:  Orthopedic Surgery   Contact information:   [id number] [phone]      Signed: AArdeen Jourdain PA-C Orthopaedic Surgery 10/15/2014, 12:26 PM

## 2017-11-11 ENCOUNTER — Ambulatory Visit: Payer: 59 | Attending: Neurosurgery | Admitting: Physical Therapy

## 2017-11-11 ENCOUNTER — Other Ambulatory Visit: Payer: Self-pay

## 2017-11-11 ENCOUNTER — Encounter: Payer: Self-pay | Admitting: Physical Therapy

## 2017-11-11 DIAGNOSIS — M62838 Other muscle spasm: Secondary | ICD-10-CM | POA: Diagnosis present

## 2017-11-11 DIAGNOSIS — M542 Cervicalgia: Secondary | ICD-10-CM | POA: Diagnosis present

## 2017-11-11 NOTE — Therapy (Signed)
[facility] [address], [address] [place], [place], [address] Phone: [phone]   Fax:  [phone]  Physical Therapy Evaluation  Patient Details  Name: Brian GivensKevin Dismore MRN: [id number] Date of Birth: 1976/01/16 Referring Provider: Dr. Lauralyn Primesobert Nudleman   Encounter Date: 11/11/2017  PT End of Session - 11/11/17 1257    Visit Number  1    Date for PT Re-Evaluation  12/23/17    Authorization Type  [employer]    PT Start Time  1100    PT Stop Time  1140    PT Time Calculation (min)  40 min    Activity Tolerance  Patient tolerated treatment well    Behavior During Therapy  [facility] for tasks assessed/performed       Past Medical History:  Diagnosis Date  . GERD (gastroesophageal reflux disease)     Past Surgical History:  Procedure Laterality Date  . HEMI-MICRODISCECTOMY LUMBAR LAMINECTOMY LEVEL 1 Left 10/11/2014   Procedure: CENTRAL DECOMPRESSION FOR STENOSIS, LAMINECTOMY, MICRODISCECTOMY LUMBAR FIVE TO SACRAL ONE LEFT;  Surgeon: Ranee Gosselinonald Gioffre, MD;  Location: [facility];  Service: Orthopedics;  Laterality: Left;  . right knee arthroscopy     . VASECTOMY      There were no vitals filed for this visit.   Subjective Assessment - 11/11/17 1103    Subjective  Patient reports 12 weeks ago the chronic pain is more aggravating.  I try to stretch the neck and use a tennis ball to get rid of knots. Patient will get numbness in the last 2 fingers when stretching the right arm up and out.     Patient Stated Goals  strengthen muscles to reduce the muscle knots    Currently in Pain?  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 Lauralyn Primesobert Nudleman    Onset Date/Surgical Date  08/11/17    Hand Dominance  Right    Prior Therapy  none      Precautions   Precautions  None      Restrictions   Weight Bearing Restrictions  No      Balance Screen   Has the patient fallen in the past 6 months  No    Has the patient had a decrease in activity level because of a fear of falling?   No    Is the patient reluctant to leave their home because of a fear of falling?   No      Home Public house managernvironment   Living Environment  Private residence      Prior Function   Level of Independence  Independent    Vocation  Full time employment    Vocation Requirements  desk    Leisure  coach, gym 5 days per week, walk 15-30 min.       Cognition   Overall Cognitive Status  Within Functional Limits for tasks assessed      Observation/Other Assessments   Focus on Therapeutic Outcomes (FOTO)   31% limitation; 22% goal      Posture/Postural Control   Posture/Postural Control  No significant limitations      ROM / Strength   AROM / PROM / Strength  AROM;PROM;Strength      AROM   Overall AROM   Unable to assess    Overall AROM Comments  sidebend to left decreased C curve    Cervical Flexion  35    Cervical Extension  50    Cervical - Right Side Bend  20    Cervical - Left Side Bend  20    Cervical - Right Rotation  50    Cervical - Left Rotation  40    Thoracic - Right Rotation  50% limitation    Thoracic - Left Rotation  25% limitation      Strength   Overall Strength Comments  bil. shoulder strength is 5/5    Right Hand Grip (lbs)  138    Left Hand Grip (lbs)  120      Palpation   Spinal mobility  decreased mobility C7-T8    Palpation comment  thicker at C6-T2 on left.  Tenderness located in cervcial paraspinals, interscapular, and suboccipitals                Objective measurements completed on examination: See above findings.      [facility] Adult PT Treatment/Exercise - 11/11/17 0001      Manual Therapy   Manual Therapy  Soft tissue mobilization    Soft tissue mobilization  cervical and thoracic musculature       Trigger Point Dry Needling - 11/11/17 1314    Consent Given?  Yes    Education Handout Provided  Yes    Muscles Treated Upper Body  Upper trapezius   C6-T2 on left multifidi   Upper Trapezius Response  Twitch reponse elicited;Palpable increased muscle length           PT Education - 11/11/17 1256    Education Details  information on dry needling    Person(s) Educated  Patient    Methods  Explanation;Demonstration;Verbal cues;Handout    Comprehension  Returned demonstration;Verbalized understanding       PT Short Term Goals - 11/11/17 1312      PT SHORT TERM GOAL #1   Title  independent with initial HEP    Time  3    Period  Weeks    Status  New    Target Date  12/02/17      PT SHORT TERM GOAL #2   Title  abiltiy to have full thoracic rotation with right equal to left due to improved mobiltiy    Time  3    Period  Weeks    Status  New    Target Date  12/02/17      PT SHORT TERM GOAL #3   Title  discomfort with carrying a backpack decreased >/= 25%    Time  3    Period  Weeks    Status  New    Target Date  12/02/17      PT SHORT TERM GOAL #4   Title  discomfort with sitting at his desk decreased >/= 25%  Time  3    Period  Weeks    Status  New    Target Date  12/02/17        PT Long Term Goals - 11/11/17 1141      PT LONG TERM GOAL #1   Title  independent with HEP and understand how to progress at home    Time  6    Period  Weeks    Status  New    Target Date  12/23/17      PT LONG TERM GOAL #2   Title  be able to turn head to look behind him due to cervical rotation >/= 70 degrees     Time  6    Period  Weeks    Status  New    Target Date  12/23/17      PT LONG TERM GOAL #3   Title  able to carry backpack to work with no increased in pain >/= 3/10    Time  6    Period  Weeks    Status  New    Target Date  12/23/17      PT LONG TERM GOAL #4   Title  not have to pop or manipulate his neck due to increased mobility and improved muscle strength     Time  6    Period  Weeks    Status  New    Target Date  12/23/17             Plan - 11/11/17 1258    Clinical Impression Statement  Patient is a 42 year old male with chronic cervical pain and headaches for 10 years.  Patient reports his pain level is 7/10 with carrying his backpack, sitting at a desk, and driving. Patient has tightness in the trapezius, interscapular and cervical musculature.  Patient reports occasional tingling in right last 2 digits.  Cervical ROM is limited. Thoracic rotation is limited with right greater than left.   Patient will frequently pop his neck and adjust his neck.  Patient will benefit from skilled therapy to improve mobility and strength to tolerate daily tasks.     History and Personal Factors relevant to plan of care:  Left discectomy 2016    Clinical Presentation  Stable    Clinical Presentation due to:  stable condition    Clinical Decision Making  Low    Rehab Potential  Excellent    Clinical Impairments Affecting Rehab Potential  Left discectomy 2016    PT Frequency  2x / week    PT Duration  6 weeks    PT Treatment/Interventions  Cryotherapy;Electrical Stimulation;Moist Heat;Traction;Ultrasound;Therapeutic exercise;Therapeutic activities;Neuromuscular re-education;Patient/family education;Manual techniques;Passive range of motion;Dry needling;Taping    PT Next Visit Plan  see if dry needling helped; mobilization to cervical and thoracic; ways to manage pain; cervical traction; interscapular strength; cervical and thoracic stretch    Consulted and Agree with Plan of Care  Patient        Patient will benefit from skilled therapeutic intervention in order to improve the following deficits and impairments:  Pain, Increased fascial restricitons, Decreased mobility, Increased muscle spasms, Decreased activity tolerance, Decreased endurance, Decreased range of motion, Decreased strength  Visit Diagnosis: Cervicalgia - Plan: PT plan of care cert/re-cert  Other muscle spasm - Plan: PT plan of care cert/re-cert     Problem List Patient Active Problem List   Diagnosis Date Noted  . Spinal stenosis, lumbar region, with neurogenic claudication 10/11/2014  Eulis Foster, PT 11/11/17 1:18 PM   [facility] [address], [address] [place], [place], [address] Phone: [phone]   Fax:  [phone]  Name: Enrigue Hashimi MRN: [id number] Date of Birth: 03-16-1975

## 2017-11-11 NOTE — Patient Instructions (Signed)

## 2017-11-17 ENCOUNTER — Ambulatory Visit: Payer: 59 | Admitting: Physical Therapy

## 2017-11-19 ENCOUNTER — Ambulatory Visit: Payer: 59

## 2017-11-19 DIAGNOSIS — M62838 Other muscle spasm: Secondary | ICD-10-CM

## 2017-11-19 DIAGNOSIS — M542 Cervicalgia: Secondary | ICD-10-CM | POA: Diagnosis not present

## 2017-11-19 NOTE — Patient Instructions (Addendum)
Cervico-Thoracic: Extension / Rotation (Sitting)    Reach across body with left arm and grasp back of chair. Gently look over right side shoulder. Hold _20___ seconds. Relax. Repeat __3__ times per set. Do __1__ sets per session. Do ___3_ sessions per day.  [email or web address]   Access Code: [email or web address]  URL: [email or web address]  Date: 11/19/2017  Prepared by: Lorrene Reid   Exercises  Seated Cervical Flexion AROM - 3 reps - 20 hold - 3x daily - 7x weekly  Seated Cervical Sidebending AROM - 3 reps - 20 hold - 3x daily - 7x weekly  Seated Cervical Rotation AROM - 3 reps - 20 hold - 3x daily - 7x weekly  Seated Correct Posture - 10 reps - 3 sets - 1x daily - 7x weekly    Copyright  VHI. All rights reserved.

## 2017-11-19 NOTE — Therapy (Signed)
[facility] [address], [address] [place], [place], [address] Phone: [phone]   Fax:  [phone]  Physical Therapy Treatment  Patient Details  Name: Brian Case MRN: [id number] Date of Birth: [birth year] Referring Provider: Dr. Lauralyn Primes   Encounter Date: 11/19/2017  PT End of Session - 11/19/17 1015    Visit Number  2    Date for PT Re-Evaluation  12/23/17    Authorization Type  Cigna    PT Start Time  0933    PT Stop Time  1025    PT Time Calculation (min)  52 min    Activity Tolerance  Patient tolerated treatment well    Behavior During Therapy  [facility] for tasks assessed/performed       Past Medical History:  Diagnosis Date  . GERD (gastroesophageal reflux disease)     Past Surgical History:  Procedure Laterality Date  . HEMI-MICRODISCECTOMY LUMBAR LAMINECTOMY LEVEL 1 Left 10/11/2014   Procedure: CENTRAL DECOMPRESSION FOR STENOSIS, LAMINECTOMY, MICRODISCECTOMY LUMBAR FIVE TO SACRAL ONE LEFT;  Surgeon: Ranee Gosselin, MD;  Location: [facility];  Service: Orthopedics;  Laterality: Left;  . right knee arthroscopy     . VASECTOMY      There were no vitals filed for this visit.  Subjective Assessment - 11/19/17 0933    Subjective  I am pretty stiff today.      Patient Stated Goals  strengthen muscles to reduce the muscle knots    Currently in Pain?  Yes    Pain Score  7     Pain Location  Neck    Pain Orientation  Right;Left    Pain Descriptors / Indicators  Tightness    Pain Type  Chronic pain    Pain Onset  More than a month ago    Pain Frequency  Intermittent    Aggravating Factors   walking into work with laptop, use of arms    Pain Relieving Factors  stretching, pushing on the knots, movement to change position                       [facility] Adult PT Treatment/Exercise - 11/19/17 0001      Exercises   Exercises  Shoulder;Neck      Modalities   Modalities  Moist Heat      Moist  Heat Therapy   Number Minutes Moist Heat  10 Minutes    Moist Heat Location  Cervical;Other (comment)   thoracic spine     Manual Therapy   Manual Therapy  Soft tissue mobilization    Soft tissue mobilization  elongation to bil upper traps and cervical paraspinals and suboccipitals       Neck Exercises: Stretches   Other Neck Stretches  cervical A/ROM 3 ways 3x20 seconds    Other Neck Stretches  cervicothoracic rotation 3x20 seconds       Trigger Point Dry Needling - 11/19/17 0939    Consent Given?  Yes    Muscles Treated Upper Body  Upper trapezius;Oblique capitus   bil cervical and upper thoracic multifidi   Upper Trapezius Response  Twitch reponse elicited;Palpable increased muscle length    Oblique Capitus Response  Twitch response elicited;Palpable increased muscle length           PT Education - 11/19/17 0947    Education Details   Access Code: [email or web address]     Person(s) Educated  Patient    Methods  Explanation;Demonstration;Handout    Comprehension  Verbalized understanding;Returned demonstration       PT Short Term Goals - 11/11/17 1312      PT SHORT TERM GOAL #1   Title  independent with initial HEP    Time  3    Period  Weeks    Status  New    Target Date  12/02/17      PT SHORT TERM GOAL #2   Title  abiltiy to have full thoracic rotation with right equal to left due to improved mobiltiy    Time  3    Period  Weeks    Status  New    Target Date  12/02/17      PT SHORT TERM GOAL #3   Title  discomfort with carrying a backpack decreased >/= 25%    Time  3    Period  Weeks    Status  New    Target Date  12/02/17      PT SHORT TERM GOAL #4   Title  discomfort with sitting at his desk decreased >/= 25%    Time  3    Period  Weeks    Status  New    Target Date  12/02/17        PT Long Term Goals - 11/11/17 1141      PT LONG TERM GOAL #1   Title  independent with HEP and understand how to progress at home    Time  6    Period  Weeks    Status   New    Target Date  12/23/17      PT LONG TERM GOAL #2   Title  be able to turn head to look behind him due to cervical rotation >/= 70 degrees    Time  6    Period  Weeks    Status  New    Target Date  12/23/17      PT LONG TERM GOAL #3   Title  able to carry backpack to work with no increased in pain >/= 3/10    Time  6    Period  Weeks    Status  New    Target Date  12/23/17      PT LONG TERM GOAL #4   Title  not have to pop or manipulate his neck due to increased mobility and improved muscle strength     Time  6    Period  Weeks    Status  New    Target Date  12/23/17            Plan - 11/19/17 0942    Clinical Impression Statement  Pt with first time follow up after evaluation.  PT initiated a HEP for cervical and thoracic flexibility.  Pt demonsrates good posture with exercises and minor verbal cues provided for scapular position.  Pt with active trigger points in bil upper traps, cervical and thoracic paraspinals and demonstrated improved tissue mobility and improved A/ROM after dry needling today.  Pt will continue to benefit from skilled PT to address cervical flexibility, thoracic mobility and trigger points in the cervical and thoracic spine to reduce pain.      Rehab Potential  Excellent    Clinical Impairments Affecting Rehab Potential  Left discectomy 2016    PT Frequency  2x / week    PT Duration  6 weeks    PT Treatment/Interventions  Cryotherapy;Electrical Stimulation;Moist Heat;Traction;Ultrasound;Therapeutic exercise;Therapeutic activities;Neuromuscular re-education;Patient/family education;Manual techniques;Passive range of motion;Dry needling;Taping  PT Next Visit Plan  see if dry needling helped; mobilization to cervical and thoracic; ways to manage pain; cervical traction; interscapular strength;    Consulted and Agree with Plan of Care  Patient       Patient will benefit from skilled therapeutic intervention in order to improve the following  deficits and impairments:  Pain, Increased fascial restricitons, Decreased mobility, Increased muscle spasms, Decreased activity tolerance, Decreased endurance, Decreased range of motion, Decreased strength  Visit Diagnosis: Cervicalgia  Other muscle spasm     Problem List Patient Active Problem List   Diagnosis Date Noted  . Spinal stenosis, lumbar region, with neurogenic claudication 10/11/2014     Lorrene Reid, PT 11/19/17 10:16 AM   [facility] [address] [place], [place], [address] Phone: [phone]   Fax:  [phone]  Name: Brian Case MRN: [id number] Date of Birth: March 04, 1975

## 2017-11-24 ENCOUNTER — Encounter: Payer: Self-pay | Admitting: Physical Therapy

## 2017-11-24 ENCOUNTER — Ambulatory Visit: Payer: 59 | Attending: Neurosurgery | Admitting: Physical Therapy

## 2017-11-24 DIAGNOSIS — M62838 Other muscle spasm: Secondary | ICD-10-CM

## 2017-11-24 DIAGNOSIS — M542 Cervicalgia: Secondary | ICD-10-CM | POA: Insufficient documentation

## 2017-11-24 NOTE — Therapy (Signed)
[facility] [address] [place], [place], [address] Phone: [phone]   Fax:  [phone]  Physical Therapy Treatment  Patient Details  Name: Brian Case MRN: [id number] Date of Birth: [birth year] Referring Provider (PT): Dr. Lauralyn Primes   Encounter Date: 11/24/2017  PT End of Session - 11/24/17 1229    Visit Number  3    Date for PT Re-Evaluation  12/23/17    Authorization Type  [employer]igna    PT Start Time  1145    PT Stop Time  1225    PT Time Calculation (min)  40 min    Activity Tolerance  Patient tolerated treatment well    Behavior During Therapy  [facility] for tasks assessed/performed       Past Medical History:  Diagnosis Date  . GERD (gastroesophageal reflux disease)     Past Surgical History:  Procedure Laterality Date  . HEMI-MICRODISCECTOMY LUMBAR LAMINECTOMY LEVEL 1 Left 10/11/2014   Procedure: CENTRAL DECOMPRESSION FOR STENOSIS, LAMINECTOMY, MICRODISCECTOMY LUMBAR FIVE TO SACRAL ONE LEFT;  Surgeon: Ranee Gosselin, MD;  Location: [facility];  Service: Orthopedics;  Laterality: Left;  . right knee arthroscopy     . VASECTOMY      There were no vitals filed for this visit.  Subjective Assessment - 11/24/17 1146    Subjective  The dry needling helps over all. The pain is different.  It is not as tight and pain is sharper. Yesterday was very painful. I have not had headaches.     Patient Stated Goals  strengthen muscles to reduce the muscle knots    Currently in Pain?  Yes    Pain Score  6     Pain Location  Neck    Pain Orientation  Right;Left    Pain Descriptors / Indicators  Tightness    Pain Radiating Towards  can radiate to a headache    Pain Onset  More than a month ago    Aggravating Factors   walking into work with laptop, use of arms    Pain Relieving Factors  stretching, pushing on the knots, movement to change position    Multiple Pain Sites  No                        [facility] Adult PT Treatment/Exercise - 11/24/17 0001      Exercises   Exercises  Neck      Neck Exercises: Machines for Strengthening   UBE (Upper Arm Bike)  2 min forward/2 min backward level 1      Neck Exercises: Seated   Other Seated Exercise  seated thoracic rotation stretch      Neck Exercises: Supine   Other Supine Exercise  lay supine on foam roll-decompress with shoulder in ER, bil. shoulder flexion, protraction/retraction of shoulder at 90 degree flexion, Y motion using green band;       Manual Therapy   Manual Therapy  Soft tissue mobilization;Joint mobilization    Joint Mobilization  C4-T3 P-A mobilization, Sideglide to C4-C7 grade 3    Soft tissue mobilization  left interscapular, left levator, bil. upper trap, cervical paraspinals,        Trigger Point Dry Needling - 11/24/17 1205    Consent Given?  Yes    Muscles Treated Upper Body  Upper trapezius;Levator scapulae;Rhomboids   left T1-T3 multifidi   Upper Trapezius Response  Twitch reponse elicited;Palpable increased muscle length    Levator Scapulae Response  Twitch response elicited;Palpable increased muscle length    Rhomboids Response  Twitch response elicited;Palpable increased muscle length           PT Education - 11/24/17 1229    Education Details  thoracic rotation and thread the needle stretch    Person(s) Educated  Patient    Methods  Explanation;Demonstration;Verbal cues;Handout    Comprehension  Returned demonstration;Verbalized understanding       PT Short Term Goals - 11/24/17 1233      PT SHORT TERM GOAL #1   Title  independent with initial HEP    Time  3    Period  Weeks    Status  Achieved      PT SHORT TERM GOAL #2   Title  abiltiy to have full thoracic rotation with right equal to left due to improved mobiltiy    Time  3    Period  Weeks    Status  On-going      PT SHORT TERM GOAL #3   Title  discomfort with carrying a backpack decreased >/=  25%    Time  3    Period  Weeks    Status  On-going      PT SHORT TERM GOAL #4   Title  discomfort with sitting at his desk decreased >/= 25%    Time  3    Period  Weeks    Status  On-going        PT Long Term Goals - 11/11/17 1141      PT LONG TERM GOAL #1   Title  independent with HEP and understand how to progress at home    Time  6    Period  Weeks    Status  New    Target Date  12/23/17      PT LONG TERM GOAL #2   Title  be able to turn head to look behind him due to cervical rotation >/= 70 degrees    Time  6    Period  Weeks    Status  New    Target Date  12/23/17      PT LONG TERM GOAL #3   Title  able to carry backpack to work with no increased in pain >/= 3/10    Time  6    Period  Weeks    Status  New    Target Date  12/23/17      PT LONG TERM GOAL #4   Title  not have to pop or manipulate his neck due to increased mobility and improved muscle strength     Time  6    Period  Weeks    Status  New    Target Date  12/23/17            Plan - 11/24/17 1159    Clinical Impression Statement  Patient has increased mobility of cervical and thoracic area.  Patient needs stretch to cervical.  Patient has improved cervical mobility.  Patient has no headaches since last visit. Patient will benefit from skilled therapy to address cervical flexibility, thoracic mobility and trigger points in the cervical and thoracic spine to reduce pain.     Rehab Potential  Excellent    Clinical Impairments Affecting Rehab Potential  Left discectomy 2016    PT Frequency  2x / week    PT Duration  6 weeks    PT Treatment/Interventions  Cryotherapy;Electrical Stimulation;Moist Heat;Traction;Ultrasound;Therapeutic exercise;Therapeutic activities;Neuromuscular re-education;Patient/family education;Manual techniques;Passive range of  motion;Dry needling;Taping    PT Next Visit Plan  see if dry needling next week; mobilization to cervical and thoracic; ways to manage pain;   interscapular strength;    PT Home Exercise Plan  Access Code: [email or web address]     Recommended Other Services  MD signed initial eval    Consulted and Agree with Plan of Care  Patient       Patient will benefit from skilled therapeutic intervention in order to improve the following deficits and impairments:  Pain, Increased fascial restricitons, Decreased mobility, Increased muscle spasms, Decreased activity tolerance, Decreased endurance, Decreased range of motion, Decreased strength  Visit Diagnosis: Cervicalgia  Other muscle spasm     Problem List Patient Active Problem List   Diagnosis Date Noted  . Spinal stenosis, lumbar region, with neurogenic claudication 10/11/2014    Eulis Foster, PT 11/24/17 12:34 PM    [facility] [address] [place], [place], [address] Phone: [phone]   Fax:  [phone]  Name: Brian Case MRN: [id number] Date of Birth: August 30, 1975

## 2017-11-26 ENCOUNTER — Ambulatory Visit: Payer: 59

## 2017-11-26 DIAGNOSIS — M62838 Other muscle spasm: Secondary | ICD-10-CM

## 2017-11-26 DIAGNOSIS — M542 Cervicalgia: Secondary | ICD-10-CM

## 2017-11-26 NOTE — Therapy (Signed)
[facility] [address], [address] [place], [place], [address] Phone: [phone]   Fax:  [phone]  Physical Therapy Treatment  Patient Details  Name: Brian Case MRN: [id number] Date of Birth: Feb 14, 1976 Referring Provider (PT): Dr. Lauralyn Primes   Encounter Date: 11/26/2017  PT End of Session - 11/26/17 1059    Visit Number  4    Date for PT Re-Evaluation  12/23/17    Authorization Type  [employer]    PT Start Time  1016    PT Stop Time  1058    PT Time Calculation (min)  42 min    Activity Tolerance  Patient tolerated treatment well    Behavior During Therapy  [facility] for tasks assessed/performed       Past Medical History:  Diagnosis Date  . GERD (gastroesophageal reflux disease)     Past Surgical History:  Procedure Laterality Date  . HEMI-MICRODISCECTOMY LUMBAR LAMINECTOMY LEVEL 1 Left 10/11/2014   Procedure: CENTRAL DECOMPRESSION FOR STENOSIS, LAMINECTOMY, MICRODISCECTOMY LUMBAR FIVE TO SACRAL ONE LEFT;  Surgeon: Ranee Gosselin, MD;  Location: [facility];  Service: Orthopedics;  Laterality: Left;  . right knee arthroscopy     . VASECTOMY      There were no vitals filed for this visit.  Subjective Assessment - 11/26/17 1025    Subjective  The pain is less sharp now.  I have been feeling better this week than last week.  25% overall improvement since the start of care.      Patient Stated Goals  strengthen muscles to reduce the muscle knots    Currently in Pain?  Yes    Pain Score  5     Pain Location  Neck    Pain Orientation  Right;Left                       [facility] Adult PT Treatment/Exercise - 11/26/17 0001      Exercises   Exercises  Neck;Lumbar      Neck Exercises: Machines for Strengthening   UBE (Upper Arm Bike)  Level 2x 6 minutes (3/3)   verbal cues for posture     Neck Exercises: Supine   Other Supine Exercise  lay supine on foam roll-decompress with shoulder in ER, bil. shoulder  flexion, protraction/retraction of shoulder at 90 degree flexion, Y motion using green band;       Lumbar Exercises: Sidelying   Other Sidelying Lumbar Exercises  open book stretch x10 bil      Lumbar Exercises: Quadruped   [place]  10 reps   tactile cues for thoracic segmental mobility   Other Quadruped Lumbar Exercises  childs pose and lateral childs pose stretch 2x20 seconds      Manual Therapy   Joint Mobilization  T3-5 grade 3             PT Education - 11/26/17 1042    Education Details   Access Code: [email or web address]     Person(s) Educated  Patient    Methods  Explanation;Demonstration;Handout    Comprehension  Verbalized understanding;Returned demonstration       PT Short Term Goals - 11/26/17 1026      PT SHORT TERM GOAL #2   Title  abiltiy to have full thoracic rotation with right equal to left due to improved mobiltiy    Time  3    Period  Weeks    Status  On-going      PT SHORT  TERM GOAL #3   Title  discomfort with carrying a backpack decreased >/= 25%    Status  Achieved      PT SHORT TERM GOAL #4   Title  discomfort with sitting at his desk decreased >/= 25%    Status  Achieved        PT Long Term Goals - 11/11/17 1141      PT LONG TERM GOAL #1   Title  independent with HEP and understand how to progress at home    Time  6    Period  Weeks    Status  New    Target Date  12/23/17      PT LONG TERM GOAL #2   Title  be able to turn head to look behind him due to cervical rotation >/= 70 degrees    Time  6    Period  Weeks    Status  New    Target Date  12/23/17      PT LONG TERM GOAL #3   Title  able to carry backpack to work with no increased in pain >/= 3/10    Time  6    Period  Weeks    Status  New    Target Date  12/23/17      PT LONG TERM GOAL #4   Title  not have to pop or manipulate his neck due to increased mobility and improved muscle strength     Time  6    Period  Weeks    Status  New    Target Date  12/23/17             Plan - 11/26/17 1033    Clinical Impression Statement  Pt reports 25% overall improvement in symptoms since the start of care.  Pt is making good progress with dry needling with improved cervical and thoracic mobility.  Pt with reduced thoracic mobility and scapular pain with movement.  Pt requires minor tactile cues for exercise in quadruped for correct technique.  Pt will continue to benefit from skilled PT for scapular strength, thoracic and cervical mobility and flexibility exercises.      Rehab Potential  Excellent    Clinical Impairments Affecting Rehab Potential  Left discectomy 2016    PT Frequency  2x / week    PT Duration  6 weeks    PT Treatment/Interventions  Cryotherapy;Electrical Stimulation;Moist Heat;Traction;Ultrasound;Therapeutic exercise;Therapeutic activities;Neuromuscular re-education;Patient/family education;Manual techniques;Passive range of motion;Dry needling;Taping    PT Next Visit Plan  dry needling as needed; mobilization to cervical and thoracic; ways to manage pain;  interscapular strength;    PT Home Exercise Plan  Access Code: [email or web address]     Consulted and Agree with Plan of Care  Patient       Patient will benefit from skilled therapeutic intervention in order to improve the following deficits and impairments:  Pain, Increased fascial restricitons, Decreased mobility, Increased muscle spasms, Decreased activity tolerance, Decreased endurance, Decreased range of motion, Decreased strength  Visit Diagnosis: Cervicalgia  Other muscle spasm     Problem List Patient Active Problem List   Diagnosis Date Noted  . Spinal stenosis, lumbar region, with neurogenic claudication 10/11/2014   Lorrene Reid, PT 11/26/17 11:00 AM  [facility] [address], [address] [place], [place], [address] Phone: [phone]   Fax:  [phone]  Name: Brian Case MRN: [id number] Date of Birth: 05-15-1975

## 2017-11-26 NOTE — Patient Instructions (Signed)
Access Code: [email or web address]  URL: [email or web address]  Date: 11/26/2017  Prepared by: Lorrene Reid   Exercises   Supine Shoulder Horizontal Abduction with Resistance - 10 reps - 2 sets - 2x daily - 7x weekly  Supine Bilateral Shoulder External Rotation with Resistance - 10 reps - 2 sets - 2x daily - 7x weekly  Supine PNF D2 Flexion with Resistance - 10 reps - 2 sets - 2x daily - 7x weekly  Sidelying Open Book Thoracic Rotation with Knee on Foam Roll - 10 reps - 1 sets - 2x daily - 7x weekly

## 2017-12-01 ENCOUNTER — Ambulatory Visit: Payer: 59

## 2017-12-01 DIAGNOSIS — M62838 Other muscle spasm: Secondary | ICD-10-CM

## 2017-12-01 DIAGNOSIS — M542 Cervicalgia: Secondary | ICD-10-CM

## 2017-12-01 NOTE — Therapy (Signed)
[facility] [address] [place], [place], [address] Phone: [phone]   Fax:  [phone]  Physical Therapy Treatment  Patient Details  Name: Brian Case MRN: [id number] Date of Birth: 03-Nov-1975 Referring Provider (PT): Dr. Lauralyn Primes   Encounter Date: 12/01/2017  PT End of Session - 12/01/17 1223    Visit Number  5    Date for PT Re-Evaluation  12/23/17    Authorization Type  [employer]    PT Start Time  1147    PT Stop Time  1230    PT Time Calculation (min)  43 min    Activity Tolerance  Patient tolerated treatment well    Behavior During Therapy  [facility] for tasks assessed/performed       Past Medical History:  Diagnosis Date  . GERD (gastroesophageal reflux disease)     Past Surgical History:  Procedure Laterality Date  . HEMI-MICRODISCECTOMY LUMBAR LAMINECTOMY LEVEL 1 Left 10/11/2014   Procedure: CENTRAL DECOMPRESSION FOR STENOSIS, LAMINECTOMY, MICRODISCECTOMY LUMBAR FIVE TO SACRAL ONE LEFT;  Surgeon: Ranee Gosselin, MD;  Location: [facility];  Service: Orthopedics;  Laterality: Left;  . right knee arthroscopy     . VASECTOMY      There were no vitals filed for this visit.  Subjective Assessment - 12/01/17 1148    Subjective  I am pretty tight today.  I have had a headache for a couple of days.      Currently in Pain?  Yes    Pain Score  8     Pain Location  Neck    Pain Orientation  Right;Left    Pain Descriptors / Indicators  Tightness    Pain Type  Chronic pain    Pain Onset  More than a month ago    Pain Frequency  Intermittent    Aggravating Factors   not sure, use of arms    Pain Relieving Factors  stretching, trigger point release                       [facility] Adult PT Treatment/Exercise - 12/01/17 0001      Moist Heat Therapy   Number Minutes Moist Heat  10 Minutes    Moist Heat Location  Cervical;Other (comment)      Manual Therapy   Manual Therapy  Soft tissue  mobilization;Joint mobilization    Manual therapy comments  soft tissue elongation to bil suboccipitals, upper traps, cervical and thoracic paraspinals.    Joint Mobilization  T3-5 grade 3       Trigger Point Dry Needling - 12/01/17 1149    Consent Given?  Yes    Muscles Treated Upper Body  Upper trapezius;Oblique capitus   cervical and thoracic multifidi   Upper Trapezius Response  Twitch reponse elicited;Palpable increased muscle length    Oblique Capitus Response  Twitch response elicited;Palpable increased muscle length             PT Short Term Goals - 11/26/17 1026      PT SHORT TERM GOAL #2   Title  abiltiy to have full thoracic rotation with right equal to left due to improved mobiltiy    Time  3    Period  Weeks    Status  On-going      PT SHORT TERM GOAL #3   Title  discomfort with carrying a backpack decreased >/= 25%    Status  Achieved      PT SHORT  TERM GOAL #4   Title  discomfort with sitting at his desk decreased >/= 25%    Status  Achieved        PT Long Term Goals - 11/11/17 1141      PT LONG TERM GOAL #1   Title  independent with HEP and understand how to progress at home    Time  6    Period  Weeks    Status  New    Target Date  12/23/17      PT LONG TERM GOAL #2   Title  be able to turn head to look behind him due to cervical rotation >/= 70 degrees    Time  6    Period  Weeks    Status  New    Target Date  12/23/17      PT LONG TERM GOAL #3   Title  able to carry backpack to work with no increased in pain >/= 3/10    Time  6    Period  Weeks    Status  New    Target Date  12/23/17      PT LONG TERM GOAL #4   Title  not have to pop or manipulate his neck due to increased mobility and improved muscle strength     Time  6    Period  Weeks    Status  New    Target Date  12/23/17            Plan - 12/01/17 1223    Clinical Impression Statement  Pt reports increased stiffness over the past few days and onset of mild  headache.  Session focused on dry needling and manual therapy to improve tissue mobility and thoracic mobility.  Pt with trigger points and tension Lt>Rt neck and thoracic spine and demonstrated improved tissue mobility after dry needling today.  Pt will continue to benefit from skilled PT to address postural strength and thoracic/cervical mobility to reduce pain and headaches.      Rehab Potential  Excellent    Clinical Impairments Affecting Rehab Potential  Left discectomy 2016    PT Frequency  2x / week    PT Duration  6 weeks    PT Treatment/Interventions  Cryotherapy;Electrical Stimulation;Moist Heat;Traction;Ultrasound;Therapeutic exercise;Therapeutic activities;Neuromuscular re-education;Patient/family education;Manual techniques;Passive range of motion;Dry needling;Taping    PT Next Visit Plan  assess response to dry needling; mobilization to cervical and thoracic; ways to manage pain;  interscapular strength;    PT Home Exercise Plan  Access Code: [email or web address]     Consulted and Agree with Plan of Care  Patient       Patient will benefit from skilled therapeutic intervention in order to improve the following deficits and impairments:  Pain, Increased fascial restricitons, Decreased mobility, Increased muscle spasms, Decreased activity tolerance, Decreased endurance, Decreased range of motion, Decreased strength  Visit Diagnosis: Cervicalgia  Other muscle spasm     Problem List Patient Active Problem List   Diagnosis Date Noted  . Spinal stenosis, lumbar region, with neurogenic claudication 10/11/2014   Brian Case, PT 12/01/17 12:25 PM  [facility] [address] [place], [place], [address] Phone: [phone]   Fax:  [phone]  Name: Brian Case MRN: [id number] Date of Birth: August 29, 1975

## 2017-12-03 ENCOUNTER — Ambulatory Visit: Payer: 59

## 2017-12-03 DIAGNOSIS — M542 Cervicalgia: Secondary | ICD-10-CM

## 2017-12-03 DIAGNOSIS — M62838 Other muscle spasm: Secondary | ICD-10-CM

## 2017-12-03 NOTE — Therapy (Signed)
[facility] [address] [place], [place], [address] Phone: [phone]   Fax:  [phone]  Physical Therapy Treatment  Patient Details  Name: Qusay Villada MRN: [id number] Date of Birth: 1975/05/28 Referring Provider (PT): Dr. Lauralyn Primes   Encounter Date: 12/03/2017  PT End of Session - 12/03/17 1137    Visit Number  6    Date for PT Re-Evaluation  12/23/17    Authorization Type  [employer]    PT Start Time  1100    PT Stop Time  1138    PT Time Calculation (min)  38 min    Activity Tolerance  Patient tolerated treatment well    Behavior During Therapy  [facility] for tasks assessed/performed       Past Medical History:  Diagnosis Date  . GERD (gastroesophageal reflux disease)     Past Surgical History:  Procedure Laterality Date  . HEMI-MICRODISCECTOMY LUMBAR LAMINECTOMY LEVEL 1 Left 10/11/2014   Procedure: CENTRAL DECOMPRESSION FOR STENOSIS, LAMINECTOMY, MICRODISCECTOMY LUMBAR FIVE TO SACRAL ONE LEFT;  Surgeon: Ranee Gosselin, MD;  Location: [facility];  Service: Orthopedics;  Laterality: Left;  . right knee arthroscopy     . VASECTOMY      There were no vitals filed for this visit.  Subjective Assessment - 12/03/17 1104    Subjective  I feel much better.  No headache today.      Patient Stated Goals  strengthen muscles to reduce the muscle knots    Currently in Pain?  No/denies                       [facility] Adult PT Treatment/Exercise - 12/03/17 0001      Exercises   Exercises  Neck;Lumbar;Shoulder      Neck Exercises: Machines for Strengthening   UBE (Upper Arm Bike)  Level 2x 8 minutes (4/4)   verbal cues for posture     Neck Exercises: Supine   Other Supine Exercise  lay supine on foam roll-decompress with shoulder in ER, bil. shoulder flexion, protraction/retraction of shoulder at 90 degree flexion, Y motion using green band;       Lumbar Exercises: Sidelying   Other Sidelying  Lumbar Exercises  open book stretch x10 bil      Shoulder Exercises: Seated   Horizontal ABduction  Strengthening;Both;20 reps;Theraband;Limitations    Theraband Level (Shoulder Horizontal ABduction)  Level 3 (Green)    Horizontal ABduction Limitations  seated on blue ball    External Rotation  Strengthening;Both;20 reps    Theraband Level (Shoulder External Rotation)  Level 3 (Green)    External Rotation Limitations  seated on blue ball    Flexion  Strengthening;Both;20 reps;Weights;Limitations    Flexion Weight (lbs)  2    Flexion Limitations  seated on blue ball    Abduction  Strengthening;Both;20 reps;Weights;Limitations    ABduction Weight (lbs)  2    ABduction Limitations  seated on blue ball    Diagonals  Strengthening;Both;20 reps;Weights    Diagonals Weight (lbs)  2      Shoulder Exercises: Standing   Extension  Strengthening;Both;20 reps;Theraband    Theraband Level (Shoulder Extension)  Level 3 (Green)    Row  Strengthening;Both;20 reps    Theraband Level (Shoulder Row)  Level 3 (Green)      Manual Therapy   Manual Therapy  Joint mobilization    Joint Mobilization  T3-5 grade 3  PT Short Term Goals - 11/26/17 1026      PT SHORT TERM GOAL #2   Title  abiltiy to have full thoracic rotation with right equal to left due to improved mobiltiy    Time  3    Period  Weeks    Status  On-going      PT SHORT TERM GOAL #3   Title  discomfort with carrying a backpack decreased >/= 25%    Status  Achieved      PT SHORT TERM GOAL #4   Title  discomfort with sitting at his desk decreased >/= 25%    Status  Achieved        PT Long Term Goals - 11/11/17 1141      PT LONG TERM GOAL #1   Title  independent with HEP and understand how to progress at home    Time  6    Period  Weeks    Status  New    Target Date  12/23/17      PT LONG TERM GOAL #2   Title  be able to turn head to look behind him due to cervical rotation >/= 70 degrees    Time  6     Period  Weeks    Status  New    Target Date  12/23/17      PT LONG TERM GOAL #3   Title  able to carry backpack to work with no increased in pain >/= 3/10    Time  6    Period  Weeks    Status  New    Target Date  12/23/17      PT LONG TERM GOAL #4   Title  not have to pop or manipulate his neck due to increased mobility and improved muscle strength     Time  6    Period  Weeks    Status  New    Target Date  12/23/17            Plan - 12/03/17 1118    Clinical Impression Statement  Pt with relief of headache and neck symptoms after dry needling last session.  Pt reports 50% overall improvement in symptoms since the start of care. Pt remains weak in his postural stabilizing muscles and reports fatigue with theraband exercises.  Pt requires tactile and verbal cues with rowing to prevent substitution and for postural corrections.  Pt with reduced flexibility in the upper thoracic spine.  Pt will continue to benefit from skilled PT for manual, flexibility and strength progression.      Rehab Potential  Excellent    Clinical Impairments Affecting Rehab Potential  Left discectomy 2016    PT Frequency  2x / week    PT Duration  6 weeks    PT Treatment/Interventions  Cryotherapy;Electrical Stimulation;Moist Heat;Traction;Ultrasound;Therapeutic exercise;Therapeutic activities;Neuromuscular re-education;Patient/family education;Manual techniques;Passive range of motion;Dry needling;Taping    PT Next Visit Plan  dry needling as needed.; mobilization to cervical and thoracic;   interscapular strength;    PT Home Exercise Plan  Access Code: [email or web address]     Consulted and Agree with Plan of Care  Patient       Patient will benefit from skilled therapeutic intervention in order to improve the following deficits and impairments:  Pain, Increased fascial restricitons, Decreased mobility, Increased muscle spasms, Decreased activity tolerance, Decreased endurance, Decreased range of motion,  Decreased strength  Visit Diagnosis: Cervicalgia  Other muscle spasm     Problem List  Patient Active Problem List   Diagnosis Date Noted  . Spinal stenosis, lumbar region, with neurogenic claudication 10/11/2014     Lorrene Reid, PT 12/03/17 11:40 AM  [facility] [address], [address] [place], [place], [address] Phone: [phone]   Fax:  [phone]  Name: Kellis Mcadam MRN: [id number] Date of Birth: [birth year]

## 2017-12-08 ENCOUNTER — Ambulatory Visit: Payer: 59

## 2017-12-08 DIAGNOSIS — M542 Cervicalgia: Secondary | ICD-10-CM | POA: Diagnosis not present

## 2017-12-08 DIAGNOSIS — M62838 Other muscle spasm: Secondary | ICD-10-CM

## 2017-12-08 NOTE — Therapy (Signed)
[facility] [address] [place], [place], [address] Phone: [phone]   Fax:  [phone]  Physical Therapy Treatment  Patient Details  Name: Brian Case MRN: [id number] Date of Birth: 20-Jan-1976 Referring Provider (PT): Dr. Lauralyn Primes   Encounter Date: 12/08/2017  PT End of Session - 12/08/17 1226    Visit Number  7    Date for PT Re-Evaluation  12/23/17    Authorization Type  [employer]    PT Start Time  1147    PT Stop Time  1226    PT Time Calculation (min)  39 min    Activity Tolerance  Patient tolerated treatment well    Behavior During Therapy  [facility] for tasks assessed/performed       Past Medical History:  Diagnosis Date  . GERD (gastroesophageal reflux disease)     Past Surgical History:  Procedure Laterality Date  . HEMI-MICRODISCECTOMY LUMBAR LAMINECTOMY LEVEL 1 Left 10/11/2014   Procedure: CENTRAL DECOMPRESSION FOR STENOSIS, LAMINECTOMY, MICRODISCECTOMY LUMBAR FIVE TO SACRAL ONE LEFT;  Surgeon: Ranee Gosselin, MD;  Location: [facility];  Service: Orthopedics;  Laterality: Left;  . right knee arthroscopy     . VASECTOMY      There were no vitals filed for this visit.  Subjective Assessment - 12/08/17 1227    Subjective  No pain all weekend.    Currently in Pain?  No/denies                       [facility] Adult PT Treatment/Exercise - 12/08/17 0001      Exercises   Exercises  Neck;Lumbar;Shoulder      Neck Exercises: Machines for Strengthening   UBE (Upper Arm Bike)  Level 2x 8 minutes (4/4)   verbal cues for posture     Neck Exercises: Supine   Other Supine Exercise  lay supine on foam roll-decompress with shoulder in ER, bil. shoulder flexion, protraction/retraction of shoulder at 90 degree flexion, Y motion using green band;       Lumbar Exercises: Sidelying   Other Sidelying Lumbar Exercises  open book stretch x10 bil      Shoulder Exercises: Seated   Horizontal  ABduction  Strengthening;Both;20 reps;Theraband;Limitations    Theraband Level (Shoulder Horizontal ABduction)  Level 3 (Green)    Horizontal ABduction Limitations  seated on blue ball    External Rotation  Strengthening;Both;20 reps    Theraband Level (Shoulder External Rotation)  Level 3 (Green)    External Rotation Limitations  seated on blue ball    Flexion  Strengthening;Both;20 reps;Weights;Limitations    Flexion Weight (lbs)  2    Flexion Limitations  seated on blue ball    Abduction  Strengthening;Both;20 reps;Weights;Limitations    ABduction Weight (lbs)  2    ABduction Limitations  seated on blue ball    Diagonals  Strengthening;Both;20 reps;Weights    Diagonals Weight (lbs)  2      Shoulder Exercises: Prone   Flexion  Strengthening;20 reps    Extension  Strengthening;Both;20 reps    Horizontal ABduction 1  Strengthening;Both;20 reps               PT Short Term Goals - 11/26/17 1026      PT SHORT TERM GOAL #2   Title  abiltiy to have full thoracic rotation with right equal to left due to improved mobiltiy    Time  3    Period  Weeks    Status  On-going      PT SHORT TERM GOAL #3   Title  discomfort with carrying a backpack decreased >/= 25%    Status  Achieved      PT SHORT TERM GOAL #4   Title  discomfort with sitting at his desk decreased >/= 25%    Status  Achieved        PT Long Term Goals - 12/08/17 1200      PT LONG TERM GOAL #1   Title  independent with HEP and understand how to progress at home    Time  6    Period  Weeks    Status  On-going      PT LONG TERM GOAL #4   Title  not have to pop or manipulate his neck due to increased mobility and improved muscle strength     Time  6    Period  Weeks    Status  On-going            Plan - 12/08/17 1209    Clinical Impression Statement  Pt continues to report 50% overall improvement in symptoms since the start of care and denies any pain over the past week. Pt remains weak in his  postural stabilizing muscles and reports fatigue with theraband exercises. PT provided tactile cues for scapular position with seated exercise. Pt was able to perform prone strength exercises without significant challenge today.   Pt with reduced flexibility in the upper thoracic spine.   Pt will continue to benefit from skilled PT for manual, flexibility and strength progression.    Rehab Potential  Excellent    Clinical Impairments Affecting Rehab Potential  Left discectomy 2016    PT Frequency  2x / week    PT Duration  6 weeks    PT Treatment/Interventions  Cryotherapy;Electrical Stimulation;Moist Heat;Traction;Ultrasound;Therapeutic exercise;Therapeutic activities;Neuromuscular re-education;Patient/family education;Manual techniques;Passive range of motion;Dry needling;Taping    PT Next Visit Plan  dry needling as needed.; mobilization to cervical and thoracic;   interscapular strength;    PT Home Exercise Plan  Access Code: [email or web address]     Consulted and Agree with Plan of Care  Patient       Patient will benefit from skilled therapeutic intervention in order to improve the following deficits and impairments:  Pain, Increased fascial restricitons, Decreased mobility, Increased muscle spasms, Decreased activity tolerance, Decreased endurance, Decreased range of motion, Decreased strength  Visit Diagnosis: Cervicalgia  Other muscle spasm     Problem List Patient Active Problem List   Diagnosis Date Noted  . Spinal stenosis, lumbar region, with neurogenic claudication 10/11/2014   Lorrene Reid, PT 12/08/17 12:28 PM  [facility] [address] [place], [place], [address] Phone: [phone]   Fax:  [phone]  Name: Brian Case MRN: [id number] Date of Birth: 04-09-1975

## 2017-12-15 ENCOUNTER — Encounter: Payer: Self-pay | Admitting: Physical Therapy

## 2017-12-15 ENCOUNTER — Ambulatory Visit: Payer: 59 | Admitting: Physical Therapy

## 2017-12-15 DIAGNOSIS — M62838 Other muscle spasm: Secondary | ICD-10-CM

## 2017-12-15 DIAGNOSIS — M542 Cervicalgia: Secondary | ICD-10-CM

## 2017-12-15 NOTE — Therapy (Signed)
[facility] [address] [place], [place], [address] Phone: [phone]   Fax:  [phone]  Physical Therapy Treatment  Patient Details  Name: Brian Case MRN: [id number] Date of Birth: 1976/02/19 Referring Provider (PT): Dr. Lauralyn Primes   Encounter Date: 12/15/2017  PT End of Session - 12/15/17 1143    Visit Number  8    Date for PT Re-Evaluation  12/23/17    Authorization Type  [employer]    PT Start Time  1110   late due to a meeting   PT Stop Time  1143    PT Time Calculation (min)  33 min    Activity Tolerance  Patient tolerated treatment well    Behavior During Therapy  [facility] for tasks assessed/performed       Past Medical History:  Diagnosis Date  . GERD (gastroesophageal reflux disease)     Past Surgical History:  Procedure Laterality Date  . HEMI-MICRODISCECTOMY LUMBAR LAMINECTOMY LEVEL 1 Left 10/11/2014   Procedure: CENTRAL DECOMPRESSION FOR STENOSIS, LAMINECTOMY, MICRODISCECTOMY LUMBAR FIVE TO SACRAL ONE LEFT;  Surgeon: Ranee Gosselin, MD;  Location: [facility];  Service: Orthopedics;  Laterality: Left;  . right knee arthroscopy     . VASECTOMY      There were no vitals filed for this visit.  Subjective Assessment - 12/15/17 1111    Subjective  I was late due to being stuck in a meeting. I had a crick in my neck when I woke up and it did not get bad. My pain is 75-80%.  I do not have to do all of the adjustment to my neck.     Patient Stated Goals  strengthen muscles to reduce the muscle knots    Currently in Pain?  No/denies    Multiple Pain Sites  No         [facility] PT Assessment - 12/15/17 0001      Observation/Other Assessments   Focus on Therapeutic Outcomes (FOTO)   28% limitation                   [facility] Adult PT Treatment/Exercise - 12/15/17 0001      Neck Exercises: Machines for Strengthening   UBE (Upper Arm Bike)  Level 2x 8 minutes (4/4)   verbal cues for posture     Shoulder Exercises: Seated   Horizontal ABduction  Strengthening;Both;20 reps;Theraband;Limitations    Theraband Level (Shoulder Horizontal ABduction)  Level 3 (Green)    Horizontal ABduction Limitations  seated on blue ball    External Rotation  Strengthening;Both;20 reps    Theraband Level (Shoulder External Rotation)  Level 3 (Green)    External Rotation Limitations  seated on blue ball    Flexion  Strengthening;Both;20 reps;Weights;Limitations    Flexion Weight (lbs)  2    Flexion Limitations  seated on blue ball    Abduction  Strengthening;Both;20 reps;Weights;Limitations    ABduction Weight (lbs)  2    ABduction Limitations  seated on blue ball    Diagonals  Strengthening;Both;20 reps;Weights    Diagonals Weight (lbs)  2      Shoulder Exercises: Prone   Flexion  Strengthening;20 reps    Extension  Strengthening;Both;20 reps    Horizontal ABduction 1  Strengthening;Both;20 reps      Manual Therapy   Manual Therapy  Joint mobilization    Joint Mobilization  C1 with cervical rotation; upward glide to T1-T3 with rotation in sitting  PT Short Term Goals - 11/26/17 1026      PT SHORT TERM GOAL #2   Title  abiltiy to have full thoracic rotation with right equal to left due to improved mobiltiy    Time  3    Period  Weeks    Status  On-going      PT SHORT TERM GOAL #3   Title  discomfort with carrying a backpack decreased >/= 25%    Status  Achieved      PT SHORT TERM GOAL #4   Title  discomfort with sitting at his desk decreased >/= 25%    Status  Achieved        PT Long Term Goals - 12/08/17 1200      PT LONG TERM GOAL #1   Title  independent with HEP and understand how to progress at home    Time  6    Period  Weeks    Status  On-going      PT LONG TERM GOAL #4   Title  not have to pop or manipulate his neck due to increased mobility and improved muscle strength     Time  6    Period  Weeks    Status  On-going            Plan  - 12/15/17 1144    Clinical Impression Statement  Patient has some endrange tightness in the cervical with rotation and looking behind him.  Patient is able to hold his head in an erect position.  Patient reports he is 80% better. Patient will benefit from skilled PT for manual , flexibility and strength progression.     Rehab Potential  Excellent    Clinical Impairments Affecting Rehab Potential  Left discectomy 2016    PT Frequency  2x / week    PT Duration  6 weeks    PT Treatment/Interventions  Cryotherapy;Electrical Stimulation;Moist Heat;Traction;Ultrasound;Therapeutic exercise;Therapeutic activities;Neuromuscular re-education;Patient/family education;Manual techniques;Passive range of motion;Dry needling;Taping    PT Next Visit Plan  dry needling as needed.; mobilization to cervical and thoracic;   interscapular strength; discharge possible; foto already done last visit    PT Home Exercise Plan  Access Code: [email or web address]     Consulted and Agree with Plan of Care  Patient       Patient will benefit from skilled therapeutic intervention in order to improve the following deficits and impairments:  Pain, Increased fascial restricitons, Decreased mobility, Increased muscle spasms, Decreased activity tolerance, Decreased endurance, Decreased range of motion, Decreased strength  Visit Diagnosis: Cervicalgia  Other muscle spasm     Problem List Patient Active Problem List   Diagnosis Date Noted  . Spinal stenosis, lumbar region, with neurogenic claudication 10/11/2014    Eulis Foster, PT 12/15/17 11:47 AM   [facility] [address] [place], [place], [address] Phone: [phone]   Fax:  [phone]  Name: Brian Case MRN: [id number] Date of Birth: 1975/06/23

## 2017-12-17 ENCOUNTER — Ambulatory Visit: Payer: 59

## 2017-12-17 DIAGNOSIS — M62838 Other muscle spasm: Secondary | ICD-10-CM

## 2017-12-17 DIAGNOSIS — M542 Cervicalgia: Secondary | ICD-10-CM | POA: Diagnosis not present

## 2017-12-17 NOTE — Therapy (Signed)
[facility] [address], [place], [place], [address] Phone: [phone]   Fax:  [phone]  Physical Therapy Treatment  Patient Details  Name: Brian Case MRN: [id number] Date of Birth: 14-Apr-1975 Referring Provider (PT): Dr. Ovidio Kin   Encounter Date: 12/17/2017  PT End of Session - 12/17/17 1209    Visit Number  9    Date for PT Re-Evaluation  12/23/17    Authorization Type  [employer]    PT Start Time  1146    PT Stop Time  1218    PT Time Calculation (min)  32 min    Activity Tolerance  Patient tolerated treatment well    Behavior During Therapy  [facility] for tasks assessed/performed       Past Medical History:  Diagnosis Date  . GERD (gastroesophageal reflux disease)     Past Surgical History:  Procedure Laterality Date  . HEMI-MICRODISCECTOMY LUMBAR LAMINECTOMY LEVEL 1 Left 10/11/2014   Procedure: CENTRAL DECOMPRESSION FOR STENOSIS, LAMINECTOMY, MICRODISCECTOMY LUMBAR FIVE TO SACRAL ONE LEFT;  Surgeon: Latanya Maudlin, MD;  Location: [facility];  Service: Orthopedics;  Laterality: Left;  . right knee arthroscopy     . VASECTOMY      There were no vitals filed for this visit.  Subjective Assessment - 12/17/17 1149    Subjective  I'm ready for discharge.  My low back is hurting because I ran yesterday.      Currently in Pain?  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 Ovidio Kin    Onset Date/Surgical Date  08/11/17      Home Environment   Living Environment  Private residence      Prior Function   Level of Independence  Independent      Observation/Other Assessments   Focus on Therapeutic Outcomes (FOTO)   28% limitation      AROM   Cervical Flexion  50    Cervical - Right Side Bend  45    Cervical - Left Side Bend  45    Cervical - Right Rotation  60     Cervical - Left Rotation  60                   [facility] Adult PT Treatment/Exercise - 12/17/17 0001      Exercises   Exercises  Neck;Lumbar;Shoulder      Neck Exercises: Machines for Strengthening   UBE (Upper Arm Bike)  Level 2x 8 minutes (4/4)   verbal cues for posture     Shoulder Exercises: Seated   Horizontal ABduction  Strengthening;Both;20 reps;Theraband;Limitations    Theraband Level (Shoulder Horizontal ABduction)  Level 4 (Blue)    Horizontal ABduction Limitations  seated on blue ball    External Rotation  Strengthening;Both;20 reps    Theraband Level (Shoulder External Rotation)  Level 4 (Blue)    External Rotation Limitations  seated on blue ball    Flexion  Strengthening;Both;20 reps;Weights;Limitations    Flexion Weight (lbs)  2    Flexion Limitations  seated on blue ball    Abduction  Strengthening;Both;20 reps;Weights;Limitations    ABduction Weight (lbs)  2    ABduction Limitations  seated on blue ball    Diagonals  Strengthening;Both;20 reps;Weights    Theraband Level (Shoulder Diagonals)  Level 4 (Blue)  d2 on ball   Diagonals Weight (lbs)  2      Shoulder Exercises: Prone   Flexion  Strengthening;20 reps    Extension  Strengthening;Both;20 reps    Horizontal ABduction 1  Strengthening;Both;20 reps      Manual Therapy   Manual Therapy  Joint mobilization    Joint Mobilization  T3-5 grade 3               PT Short Term Goals - 11/26/17 1026      PT SHORT TERM GOAL #2   Title  abiltiy to have full thoracic rotation with right equal to left due to improved mobiltiy    Time  3    Period  Weeks    Status  On-going      PT SHORT TERM GOAL #3   Title  discomfort with carrying a backpack decreased >/= 25%    Status  Achieved      PT SHORT TERM GOAL #4   Title  discomfort with sitting at his desk decreased >/= 25%    Status  Achieved        PT Long Term Goals - 12/17/17 1145      PT LONG TERM GOAL #1   Title  independent with  HEP and understand how to progress at home    Status  Achieved      PT LONG TERM GOAL #2   Title  be able to turn head to look behind him due to cervical rotation >/= 70 degrees    Baseline  60 degreees     Status  Partially Met      PT LONG TERM GOAL #3   Title  able to carry backpack to work with no increased in pain >/= 3/10    Status  Achieved      PT LONG TERM GOAL #4   Title  not have to pop or manipulate his neck due to increased mobility and improved muscle strength     Status  Achieved            Plan - 12/17/17 1205    Clinical Impression Statement  Pt reports 75-80% overall improvement since the start of care.  Pt with improved cervical A/ROM and denies pain over the past week.  Pt has HEP in place for postural strength and flexibility and will continue with these exercises after discharge.  Pt with reduced thoracic mobility in the proximal segments although this is improved overall.  Pt will D/C to HEP today.      Clinical Impairments Affecting Rehab Potential  Left discectomy 2016    PT Next Visit Plan  D/C PT to HEP    PT Home Exercise Plan  Access Code: [email or web address]     Consulted and Agree with Plan of Care  Patient       Patient will benefit from skilled therapeutic intervention in order to improve the following deficits and impairments:     Visit Diagnosis: Cervicalgia  Other muscle spasm     Problem List Patient Active Problem List   Diagnosis Date Noted  . Spinal stenosis, lumbar region, with neurogenic claudication 10/11/2014  PHYSICAL THERAPY DISCHARGE SUMMARY  Visits from Start of Care: 9  Current functional level related to goals / functional outcomes: See above for current status.     Remaining deficits: No functional deficits remain.     Education / Equipment: HEP, posture Plan: Patient agrees to discharge.  Patient goals were met. Patient is  being discharged due to meeting the stated rehab goals.  ?????        Sigurd Sos,  PT 12/17/17 12:21 PM  [facility] [address] [place], [place], [address] Phone: [phone]   Fax:  [phone]  Name: Brian Case MRN: [id number] Date of Birth: 07-Jun-1975

## 2020-05-12 ENCOUNTER — Emergency Department (HOSPITAL_COMMUNITY): Payer: 59

## 2020-05-12 ENCOUNTER — Encounter (HOSPITAL_COMMUNITY): Payer: Self-pay | Admitting: Emergency Medicine

## 2020-05-12 ENCOUNTER — Emergency Department (HOSPITAL_COMMUNITY)
Admission: EM | Admit: 2020-05-12 | Discharge: 2020-05-12 | Disposition: A | Discharge status: Home / Self Care | Payer: 59 | Attending: Emergency Medicine | Admitting: Emergency Medicine

## 2020-05-12 ENCOUNTER — Telehealth (HOSPITAL_COMMUNITY): Payer: Self-pay | Admitting: Emergency Medicine

## 2020-05-12 ENCOUNTER — Other Ambulatory Visit: Payer: Self-pay

## 2020-05-12 ENCOUNTER — Telehealth: Payer: Self-pay | Admitting: Surgery

## 2020-05-12 DIAGNOSIS — Y9389 Activity, other specified: Secondary | ICD-10-CM | POA: Diagnosis not present

## 2020-05-12 DIAGNOSIS — S61215A Laceration without foreign body of left ring finger without damage to nail, initial encounter: Secondary | ICD-10-CM

## 2020-05-12 DIAGNOSIS — S66325A Laceration of extensor muscle, fascia and tendon of left ring finger at wrist and hand level, initial encounter: Secondary | ICD-10-CM | POA: Insufficient documentation

## 2020-05-12 DIAGNOSIS — Y92009 Unspecified place in unspecified non-institutional (private) residence as the place of occurrence of the external cause: Secondary | ICD-10-CM | POA: Diagnosis not present

## 2020-05-12 DIAGNOSIS — W312XXA Contact with powered woodworking and forming machines, initial encounter: Secondary | ICD-10-CM | POA: Diagnosis not present

## 2020-05-12 DIAGNOSIS — Z23 Encounter for immunization: Secondary | ICD-10-CM | POA: Insufficient documentation

## 2020-05-12 DIAGNOSIS — S6992XA Unspecified injury of left wrist, hand and finger(s), initial encounter: Secondary | ICD-10-CM | POA: Diagnosis present

## 2020-05-12 MED ORDER — HYDROCODONE-ACETAMINOPHEN 7.5-300 MG PO TABS: 1.0000 | ORAL_TABLET | Freq: Four times a day (QID) | ORAL | 0 refills | Status: DC

## 2020-05-12 MED ORDER — IBUPROFEN 600 MG PO TABS: 600.0000 mg | ORAL_TABLET | Freq: Four times a day (QID) | ORAL | 0 refills | Status: AC | PRN

## 2020-05-12 MED ORDER — TETANUS-DIPHTH-ACELL PERTUSSIS 5-2.5-18.5 LF-MCG/0.5 IM SUSY
0.5000 mL | PREFILLED_SYRINGE | Freq: Once | INTRAMUSCULAR | Status: AC
  Administered 2020-05-12: 0.5 mL via INTRAMUSCULAR
  Filled 2020-05-12: qty 0.5

## 2020-05-12 MED ORDER — HYDROCODONE-ACETAMINOPHEN 5-325 MG PO TABS: 1.0000 | ORAL_TABLET | Freq: Four times a day (QID) | ORAL | 0 refills | Status: AC | PRN

## 2020-05-12 MED ORDER — LIDOCAINE HCL 2 % IJ SOLN
20.0000 mL | Freq: Once | INTRAMUSCULAR | Status: AC
  Administered 2020-05-12: 400 mg via INTRADERMAL
  Filled 2020-05-12: qty 20

## 2020-05-12 MED ORDER — HYDROCODONE-ACETAMINOPHEN 5-325 MG PO TABS: 1.0000 | ORAL_TABLET | Freq: Four times a day (QID) | ORAL | 0 refills | Status: DC | PRN

## 2020-05-12 NOTE — ED Notes (Signed)
Dressed with xeroform non adherent and gauze.

## 2020-05-12 NOTE — ED Provider Notes (Signed)
Brian Case [facility] EMERGENCY DEPARTMENT Provider Note   CSN: [id number] Arrival date & time: 05/12/20  1040     History Chief Complaint  Patient presents with  . Finger Laceration    Saw    Brian Case is a 45 y.o. male.  The history is provided by the patient. No language interpreter was used.     45 year old male presenting for evaluation of finger injury.  Patient report he was at home working and while turning off a circular saw he accidentally injured his left nondominant ring finger when the blade struck his finger.  Report cute onset of sharp pain to the affected finger with associated numbness.  Pain is moderate in severity.  He is unable to recall last tetanus shot.  He wrapped his finger and came here.  Patient report he is worried about tendon injury.  Past Medical History:  Diagnosis Date  . GERD (gastroesophageal reflux disease)     Patient Active Problem List   Diagnosis Date Noted  . Spinal stenosis, lumbar region, with neurogenic claudication 10/11/2014    Past Surgical History:  Procedure Laterality Date  . HEMI-MICRODISCECTOMY LUMBAR LAMINECTOMY LEVEL 1 Left 10/11/2014   Procedure: CENTRAL DECOMPRESSION FOR STENOSIS, LAMINECTOMY, MICRODISCECTOMY LUMBAR FIVE TO SACRAL ONE LEFT;  Surgeon: Brian Gosselin, MD;  Location: [facility];  Service: Orthopedics;  Laterality: Left;  . right knee arthroscopy     . VASECTOMY         No family history on file.  Social History   Tobacco Use  . Smoking status: Never Smoker  . Smokeless tobacco: Never Used  Substance Use Topics  . Alcohol use: Yes    Comment: rarely   . Drug use: No    Home Medications Prior to Admission medications   Medication Sig Start Date End Date Taking? Authorizing Provider  acetaminophen (TYLENOL) 500 MG tablet Take 2,000 mg by mouth every 6 (six) hours as needed for moderate pain.    [provider]  diclofenac (VOLTAREN) 75 MG EC tablet Take 75 mg by mouth 2 (two)  times daily.    [provider]  predniSONE (DELTASONE) 50 MG tablet Take 50 mg by mouth every morning.    [provider]    Allergies    Patient has no known allergies.  Review of Systems   Review of Systems  Constitutional: Negative for fever.  Skin: Positive for wound.  Neurological: Positive for numbness.    Physical Exam Updated Vital Signs BP (!) 137/95   Pulse 83   Temp 98.5 F (36.9 C) (Oral)   Resp 17   SpO2 97%   Physical Exam Vitals and nursing note reviewed.  Constitutional:      General: He is not in acute distress.    Appearance: He is well-developed.  HENT:     Head: Atraumatic.  Eyes:     Conjunctiva/sclera: Conjunctivae normal.  Musculoskeletal:        General: Signs of injury (Left ring finger: 2cm Horizontal laceration noted at the DIP joint volarly with decreased sensation distally but brisk cap refill.  difficulty flexing finger.) present.     Cervical back: Neck supple.  Skin:    Findings: No rash.  Neurological:     Mental Status: He is alert.     ED Results / Procedures / Treatments   Labs (all labs ordered are listed, but only abnormal results are displayed) Labs Reviewed - No data to display  EKG None  Radiology DG  Finger Ring Left  Result Date: 05/12/2020 CLINICAL DATA:  Laceration with a saw. EXAM: LEFT RING FINGER 2+V COMPARISON:  None. FINDINGS: There is no evidence of fracture or dislocation. There is no evidence of arthropathy or other focal bone abnormality. There is soft tissue stranding along the palmar aspect of the fourth digit near the D IP joint, likely representing known laceration. No radiopaque foreign body identified. IMPRESSION: No acute osseous abnormality. No radiopaque foreign body identified. Electronically Signed   By: Emmaline Kluver M.D.   On: 05/12/2020 11:26    Procedures .Marland KitchenLaceration Repair  Date/Time: 05/12/2020 1:15 PM Performed by: Fayrene Helper, PA-C Authorized by: Fayrene Helper,  PA-C   Consent:    Consent obtained:  Verbal   Consent given by:  Patient   Risks discussed:  Infection, need for additional repair, pain, poor cosmetic result, poor wound healing, tendon damage, nerve damage and vascular damage   Alternatives discussed:  No treatment and delayed treatment Universal protocol:    Procedure explained and questions answered to patient or proxy's satisfaction: yes     Relevant documents present and verified: yes     Test results available: yes     Imaging studies available: yes     Required blood products, implants, devices, and special equipment available: yes     Site/side marked: yes     Immediately prior to procedure, a time out was called: yes     Patient identity confirmed:  Verbally with patient Anesthesia:    Anesthesia method:  Local infiltration   Local anesthetic:  Lidocaine 2% w/o epi Laceration details:    Location:  Finger   Finger location:  L ring finger   Length (cm):  2   Depth (mm):  4 Pre-procedure details:    Preparation:  Patient was prepped and draped in usual sterile fashion and imaging obtained to evaluate for foreign bodies Exploration:    Limited defect created (wound extended): no     Hemostasis achieved with:  Direct pressure   Imaging obtained: x-ray     Imaging outcome: foreign body not noted     Wound exploration: wound explored through full range of motion and entire depth of wound visualized     Wound extent: muscle damage, nerve damage and tendon damage     Wound extent: no underlying fracture noted and no vascular damage noted     Tendon damage location:  Upper extremity   Upper extremity tendon damage location:  Finger flexor   Finger flexor tendon:  Flexor digitorum superficialis   Tendon damage extent:  Partial transection   Tendon repair plan:  Refer for evaluation   Contaminated: no   Treatment:    Area cleansed with:  Povidone-iodine and saline   Amount of cleaning:  Standard   Irrigation solution:   Sterile saline   Irrigation method:  Pressure wash   Visualized foreign bodies/material removed: no     Debridement:  None   Undermining:  Minimal Skin repair:    Repair method:  Sutures   Suture size:  5-0   Suture material:  Prolene   Suture technique:  Simple interrupted   Number of sutures:  7 Approximation:    Approximation:  Close Repair type:    Repair type:  Intermediate Post-procedure details:    Dressing:  Splint for protection and non-adherent dressing   Procedure completion:  Tolerated well, no immediate complications     Medications Ordered in ED Medications  Tdap (BOOSTRIX) injection 0.5 mL (0.5  mLs Intramuscular Given 05/12/20 1209)  lidocaine (XYLOCAINE) 2 % (with pres) injection 400 mg (400 mg Intradermal Given by Other 05/12/20 1211)    ED Course  I have reviewed the triage vital signs and the nursing notes.  Pertinent labs & imaging results that were available during my care of the patient were reviewed by me and considered in my medical decision making (see chart for details).    MDM Rules/Calculators/A&P                          BP 130/87   Pulse 78   Temp 98.5 F (36.9 C) (Oral)   Resp 16   SpO2 99%   Final Clinical Impression(s) / ED Diagnoses Final diagnoses:  Laceration of left ring finger with tendon involvement, initial encounter    Rx / DC Orders ED Discharge Orders         Ordered    HYDROcodone-acetaminophen (NORCO/VICODIN) 5-325 MG tablet  Every 6 hours PRN        05/12/20 1321    ibuprofen (ADVIL) 600 MG tablet  Every 6 hours PRN        05/12/20 1321         12:05 PM Accidentally cut his left none a dominant ring finger with a circular saw just prior to arrival.  He suffered a laceration to the palmar aspect of the finger at the DIP joint.  He reported having numbness to the affected area however dressing was tight, after the dressing was removed, he still endorse numbness to pad of finger and unable to flex finger.  Will  perform digital block, clean his wound and perform laceration repair.  Will update his tetanus  1:18 PM I performed laceration repair.  It appears patient does have a tendon injury and likely nerve injury at the DIP.  Finger splint placed, patient will need to follow-up closely with hand specialist for further management.  Will discharge home with opiate pain medication for pain control.   Fayrene Helper, PA-C 05/12/20 1323    Arby Barrette, MD 05/12/20 2155

## 2020-05-12 NOTE — Telephone Encounter (Signed)
Pharmacy reports they do not have very many of the 7.5\325.  They now have 5\325 strength Vicodin back in stock and I can go ahead with that order now.

## 2020-05-12 NOTE — ED Triage Notes (Signed)
C/o laceration to L 4th digit from a saw approx 40 min ago.  Denies pain.  States finger tip is numb.  Still bleeding on arrival.  Bandage placed.  Unknown DT.

## 2020-05-12 NOTE — Discharge Instructions (Signed)
You have been evaluated managed for finger injury.  It appears you have injured your tendon and likely nerve involving your left ring finger.  Take opiate pain medication when pain is intense otherwise take ibuprofen for moderate pain.  Keep finger in finger splint for protection.  Call and follow-up closely with hand specialist next week for further management.

## 2020-05-12 NOTE — Telephone Encounter (Signed)
ED RNCM received a call from patient and spouse concerning an issue with Vicodin prescription which was sent to [facility].  Vicodin is out of stock at local [facility] [place].  Secured chat message sent to B. Ardelle Park and Dr. Clarice Pole. Awaiting a response.

## 2020-05-12 NOTE — Telephone Encounter (Signed)
The Vicodin ordered is out of stock.  Plan to try to represcribe 7.5\300 1 tablet every 6 hours as needed.  We will [facility] to route to [facility] [address] for 24-hour pharmacy.

## 2020-05-13 ENCOUNTER — Telehealth (HOSPITAL_COMMUNITY): Payer: Self-pay | Admitting: Emergency Medicine

## 2020-05-14 ENCOUNTER — Other Ambulatory Visit: Payer: Self-pay

## 2020-05-14 ENCOUNTER — Encounter (HOSPITAL_BASED_OUTPATIENT_CLINIC_OR_DEPARTMENT_OTHER): Payer: Self-pay | Admitting: Plastic Surgery

## 2020-05-14 ENCOUNTER — Institutional Professional Consult (permissible substitution): Payer: 59 | Admitting: Plastic Surgery

## 2020-05-15 ENCOUNTER — Encounter (HOSPITAL_COMMUNITY): Payer: Self-pay | Admitting: Orthopedic Surgery

## 2020-05-15 ENCOUNTER — Ambulatory Visit (HOSPITAL_COMMUNITY): Payer: 59 | Admitting: Anesthesiology

## 2020-05-15 ENCOUNTER — Other Ambulatory Visit: Payer: Self-pay

## 2020-05-15 ENCOUNTER — Encounter (HOSPITAL_COMMUNITY)
Admission: RE | Disposition: A | Discharge status: Home / Self Care | Payer: Self-pay | Source: Home / Self Care | Attending: Orthopedic Surgery

## 2020-05-15 ENCOUNTER — Ambulatory Visit (HOSPITAL_COMMUNITY): Payer: 59

## 2020-05-15 ENCOUNTER — Ambulatory Visit (HOSPITAL_COMMUNITY)
Admission: RE | Admit: 2020-05-15 | Discharge: 2020-05-15 | Disposition: A | Discharge status: Home / Self Care | Payer: 59 | Attending: Orthopedic Surgery | Admitting: Orthopedic Surgery

## 2020-05-15 DIAGNOSIS — W312XXA Contact with powered woodworking and forming machines, initial encounter: Secondary | ICD-10-CM | POA: Insufficient documentation

## 2020-05-15 DIAGNOSIS — Z20822 Contact with and (suspected) exposure to covid-19: Secondary | ICD-10-CM | POA: Diagnosis not present

## 2020-05-15 DIAGNOSIS — S62635B Displaced fracture of distal phalanx of left ring finger, initial encounter for open fracture: Secondary | ICD-10-CM | POA: Diagnosis present

## 2020-05-15 HISTORY — PX: NERVE AND TENDON REPAIR: SHX5693

## 2020-05-15 LAB — SARS CORONAVIRUS 2 BY RT PCR (HOSPITAL ORDER, PERFORMED IN ~~LOC~~ HOSPITAL LAB): SARS Coronavirus 2: NEGATIVE

## 2020-05-15 SURGERY — NERVE AND TENDON REPAIR
Anesthesia: Monitor Anesthesia Care | Site: Finger | Laterality: Left

## 2020-05-15 MED ORDER — LACTATED RINGERS IV SOLN: INTRAVENOUS | Status: DC

## 2020-05-15 MED ORDER — MIDAZOLAM HCL 5 MG/5ML IJ SOLN
INTRAMUSCULAR | Status: DC | PRN
  Administered 2020-05-15: 2 mg via INTRAVENOUS

## 2020-05-15 MED ORDER — ONDANSETRON HCL 4 MG/2ML IJ SOLN: 4.0000 mg | Freq: Once | INTRAMUSCULAR | Status: DC | PRN

## 2020-05-15 MED ORDER — CEFAZOLIN SODIUM-DEXTROSE 2-4 GM/100ML-% IV SOLN
2.0000 g | INTRAVENOUS | Status: AC
  Administered 2020-05-15: 2 g via INTRAVENOUS
  Filled 2020-05-15: qty 100

## 2020-05-15 MED ORDER — CHLORHEXIDINE GLUCONATE 0.12 % MT SOLN
15.0000 mL | Freq: Once | OROMUCOSAL | Status: AC
  Administered 2020-05-15: 15 mL via OROMUCOSAL

## 2020-05-15 MED ORDER — PROPOFOL 500 MG/50ML IV EMUL
INTRAVENOUS | Status: DC | PRN
  Administered 2020-05-15: 50 ug/kg/min via INTRAVENOUS

## 2020-05-15 MED ORDER — 0.9 % SODIUM CHLORIDE (POUR BTL) OPTIME
TOPICAL | Status: DC | PRN
  Administered 2020-05-15: 1000 mL

## 2020-05-15 MED ORDER — DEXMEDETOMIDINE (PRECEDEX) IN NS 20 MCG/5ML (4 MCG/ML) IV SYRINGE
PREFILLED_SYRINGE | INTRAVENOUS | Status: DC | PRN
  Administered 2020-05-15 (×5): 4 ug via INTRAVENOUS

## 2020-05-15 MED ORDER — FENTANYL CITRATE (PF) 100 MCG/2ML IJ SOLN
100.0000 ug | Freq: Once | INTRAMUSCULAR | Status: AC
Start: 2020-05-15 — End: 2020-05-15

## 2020-05-15 MED ORDER — OXYCODONE HCL 5 MG/5ML PO SOLN
5.0000 mg | Freq: Once | ORAL | Status: DC | PRN
Start: 2020-05-15 — End: 2020-05-16

## 2020-05-15 MED ORDER — LACTATED RINGERS IV SOLN: INTRAVENOUS | Status: DC | PRN

## 2020-05-15 MED ORDER — OXYCODONE HCL 5 MG PO TABS
5.0000 mg | ORAL_TABLET | Freq: Once | ORAL | Status: DC | PRN
Start: 2020-05-15 — End: 2020-05-16

## 2020-05-15 MED ORDER — AMISULPRIDE (ANTIEMETIC) 5 MG/2ML IV SOLN: 10.0000 mg | Freq: Once | INTRAVENOUS | Status: DC | PRN

## 2020-05-15 MED ORDER — ORAL CARE MOUTH RINSE: 15.0000 mL | Freq: Once | OROMUCOSAL | Status: AC

## 2020-05-15 MED ORDER — FENTANYL CITRATE (PF) 100 MCG/2ML IJ SOLN: 25.0000 ug | INTRAMUSCULAR | Status: DC | PRN

## 2020-05-15 MED ORDER — ROPIVACAINE HCL 5 MG/ML IJ SOLN
INTRAMUSCULAR | Status: DC | PRN
  Administered 2020-05-15: 30 mL via PERINEURAL

## 2020-05-15 MED ORDER — FENTANYL CITRATE (PF) 100 MCG/2ML IJ SOLN
INTRAMUSCULAR | Status: AC
  Administered 2020-05-15: 100 ug via INTRAVENOUS
  Filled 2020-05-15: qty 2

## 2020-05-15 MED ORDER — MIDAZOLAM HCL 2 MG/2ML IJ SOLN
INTRAMUSCULAR | Status: AC
  Filled 2020-05-15: qty 2

## 2020-05-15 MED ORDER — MIDAZOLAM HCL 2 MG/2ML IJ SOLN: 2.0000 mg | Freq: Once | INTRAMUSCULAR | Status: AC

## 2020-05-15 MED ORDER — MIDAZOLAM HCL 2 MG/2ML IJ SOLN
INTRAMUSCULAR | Status: AC
  Administered 2020-05-15: 2 mg via INTRAVENOUS
  Filled 2020-05-15: qty 2

## 2020-05-15 MED ORDER — SODIUM CHLORIDE 0.9 % IR SOLN
Status: DC | PRN
  Administered 2020-05-15: 3000 mL

## 2020-05-15 SURGICAL SUPPLY — 59 items
BNDG COHESIVE 1X5 TAN STRL LF (GAUZE/BANDAGES/DRESSINGS) ×2 IMPLANT
BNDG ELASTIC 2 VLCR STRL LF (GAUZE/BANDAGES/DRESSINGS) ×4 IMPLANT
BNDG ELASTIC 3X5.8 VLCR STR LF (GAUZE/BANDAGES/DRESSINGS) ×2 IMPLANT
BNDG ELASTIC 4X5.8 VLCR STR LF (GAUZE/BANDAGES/DRESSINGS) IMPLANT
BNDG GAUZE ELAST 4 BULKY (GAUZE/BANDAGES/DRESSINGS) ×4 IMPLANT
CORD BIPOLAR FORCEPS 12FT (ELECTRODE) ×2 IMPLANT
COVER SURGICAL LIGHT HANDLE (MISCELLANEOUS) ×2 IMPLANT
COVER WAND RF STERILE (DRAPES) ×2 IMPLANT
CUFF TOURN SGL QUICK 18X4 (TOURNIQUET CUFF) ×2 IMPLANT
CUFF TOURN SGL QUICK 24 (TOURNIQUET CUFF)
CUFF TRNQT CYL 24X4X16.5-23 (TOURNIQUET CUFF) IMPLANT
DECANTER SPIKE VIAL GLASS SM (MISCELLANEOUS) ×2 IMPLANT
DRAPE SURG 17X23 STRL (DRAPES) ×2 IMPLANT
DRSG ADAPTIC 3X8 NADH LF (GAUZE/BANDAGES/DRESSINGS) ×2 IMPLANT
GAUZE SPONGE 2X2 8PLY STRL LF (GAUZE/BANDAGES/DRESSINGS) ×1 IMPLANT
GAUZE SPONGE 4X4 12PLY STRL (GAUZE/BANDAGES/DRESSINGS) ×2 IMPLANT
GAUZE XEROFORM 1X8 LF (GAUZE/BANDAGES/DRESSINGS) ×4 IMPLANT
GAUZE XEROFORM 5X9 LF (GAUZE/BANDAGES/DRESSINGS) ×2 IMPLANT
GLOVE BIOGEL M 8.0 STRL (GLOVE) ×2 IMPLANT
GLOVE SS BIOGEL STRL SZ 8 (GLOVE) ×1 IMPLANT
GLOVE SUPERSENSE BIOGEL SZ 8 (GLOVE) ×1
GOWN STRL REUS W/ TWL LRG LVL3 (GOWN DISPOSABLE) ×2 IMPLANT
GOWN STRL REUS W/ TWL XL LVL3 (GOWN DISPOSABLE) ×3 IMPLANT
GOWN STRL REUS W/TWL LRG LVL3 (GOWN DISPOSABLE) ×2
GOWN STRL REUS W/TWL XL LVL3 (GOWN DISPOSABLE) ×3
KIT BASIN OR (CUSTOM PROCEDURE TRAY) ×2 IMPLANT
KIT TURNOVER KIT B (KITS) ×2 IMPLANT
LOOP VESSEL MAXI BLUE (MISCELLANEOUS) IMPLANT
MANIFOLD NEPTUNE II (INSTRUMENTS) ×2 IMPLANT
NEEDLE HYPO 25GX1X1/2 BEV (NEEDLE) IMPLANT
NEEDLE STRAIGHT KEITH (NEEDLE) ×2 IMPLANT
NS IRRIG 1000ML POUR BTL (IV SOLUTION) ×2 IMPLANT
PACK ORTHO EXTREMITY (CUSTOM PROCEDURE TRAY) ×2 IMPLANT
PAD ARMBOARD 7.5X6 YLW CONV (MISCELLANEOUS) ×4 IMPLANT
PAD CAST 4YDX4 CTTN HI CHSV (CAST SUPPLIES) ×1 IMPLANT
PADDING CAST ABS 3INX4YD NS (CAST SUPPLIES) ×1
PADDING CAST ABS COTTON 3X4 (CAST SUPPLIES) ×1 IMPLANT
PADDING CAST COTTON 4X4 STRL (CAST SUPPLIES) ×1
PILLOW ARM CARTER ADULT (MISCELLANEOUS) ×2 IMPLANT
SET CYSTO W/LG BORE CLAMP LF (SET/KITS/TRAYS/PACK) ×2 IMPLANT
SOL PREP POV-IOD 4OZ 10% (MISCELLANEOUS) ×8 IMPLANT
SPEAR EYE SURG WECK-CEL (MISCELLANEOUS) IMPLANT
SPECIMEN JAR SMALL (MISCELLANEOUS) ×2 IMPLANT
SPLINT FIBERGLASS 4X30 (CAST SUPPLIES) ×2 IMPLANT
SPONGE GAUZE 2X2 STER 10/PKG (GAUZE/BANDAGES/DRESSINGS) ×1
SUT ETHILON 8 0 BV130 4 (SUTURE) ×2 IMPLANT
SUT MERSILENE 4 0 P 3 (SUTURE) IMPLANT
SUT PROLENE 2 0 CT2 30 (SUTURE) ×2 IMPLANT
SUT PROLENE 4 0 PS 2 18 (SUTURE) ×6 IMPLANT
SUT PROLENE 5 0 P 3 (SUTURE) ×4 IMPLANT
SUT VIC AB 2-0 CT1 27 (SUTURE) ×1
SUT VIC AB 2-0 CT1 TAPERPNT 27 (SUTURE) ×1 IMPLANT
SYR CONTROL 10ML LL (SYRINGE) IMPLANT
SYR EAR/ULCER 2OZ (SYRINGE) ×2 IMPLANT
TOWEL GREEN STERILE (TOWEL DISPOSABLE) ×2 IMPLANT
TOWEL GREEN STERILE FF (TOWEL DISPOSABLE) ×2 IMPLANT
TUBE CONNECTING 12X1/4 (SUCTIONS) IMPLANT
UNDERPAD 30X36 HEAVY ABSORB (UNDERPADS AND DIAPERS) ×2 IMPLANT
WATER STERILE IRR 1000ML POUR (IV SOLUTION) ×2 IMPLANT

## 2020-05-15 NOTE — Anesthesia Postprocedure Evaluation (Signed)
Anesthesia Post Note  Patient: Brian Case  Procedure(s) Performed: Irrigation and debridement left ring finger with repair tendon, nerve, and artery as necessary (Left Finger)     Patient location during evaluation: PACU Anesthesia Type: Regional and MAC Level of consciousness: awake and alert, patient cooperative and oriented Pain management: pain level controlled Vital Signs Assessment: post-procedure vital signs reviewed and stable Respiratory status: spontaneous breathing, nonlabored ventilation and respiratory function stable Cardiovascular status: blood pressure returned to baseline and stable Postop Assessment: able to ambulate, adequate PO intake and no apparent nausea or vomiting Anesthetic complications: no   No complications documented.  Last Vitals:  Vitals:   05/15/20 1945 05/15/20 2000  BP: 114/73 112/83  Pulse: (!) 56 (!) 54  Resp: 17 19  Temp: 36.8 C   SpO2: 100% 100%    Last Pain:  Vitals:   05/15/20 2000  TempSrc:   PainSc: 0-No pain                 Aycen Porreca,E. Ruffus Kamaka

## 2020-05-15 NOTE — Anesthesia Procedure Notes (Signed)
Procedure Name: MAC Date/Time: 05/15/2020 6:30 PM Performed by: Oletta Lamas, CRNA Pre-anesthesia Checklist: Patient identified, Emergency Drugs available, Suction available and Patient being monitored Patient Re-evaluated:Patient Re-evaluated prior to induction Oxygen Delivery Method: Simple face mask

## 2020-05-15 NOTE — Anesthesia Preprocedure Evaluation (Addendum)
Anesthesia Evaluation  Patient identified by MRN, date of birth, ID band Patient awake    Reviewed: Allergy & Precautions, NPO status , Patient's Chart, lab work & pertinent test results  History of Anesthesia Complications Negative for: history of anesthetic complications  Airway Mallampati: II  TM Distance: >3 FB Neck ROM: Full    Dental   Pulmonary neg pulmonary ROS,    Pulmonary exam normal        Cardiovascular negative cardio ROS Normal cardiovascular exam     Neuro/Psych negative neurological ROS     GI/Hepatic Neg liver ROS, GERD  ,  Endo/Other  negative endocrine ROS  Renal/GU negative Renal ROS  negative genitourinary   Musculoskeletal negative musculoskeletal ROS (+)   Abdominal   Peds  Hematology negative hematology ROS (+)   Anesthesia Other Findings  Last clears at 1520  Reproductive/Obstetrics                            Anesthesia Physical Anesthesia Plan  ASA: II  Anesthesia Plan: MAC and Regional   Post-op Pain Management:    Induction: Intravenous  PONV Risk Score and Plan: 1 and Propofol infusion, TIVA and Treatment may vary due to age or medical condition  Airway Management Planned: Natural Airway, Nasal Cannula and Simple Face Mask  Additional Equipment: None  Intra-op Plan:   Post-operative Plan:   Informed Consent: I have reviewed the patients History and Physical, chart, labs and discussed the procedure including the risks, benefits and alternatives for the proposed anesthesia with the patient or authorized representative who has indicated his/her understanding and acceptance.       Plan Discussed with:   Anesthesia Plan Comments:        Anesthesia Quick Evaluation

## 2020-05-15 NOTE — Op Note (Signed)
Operative note 05/15/2020  Dominica Severin MD  Preoperative diagnosis left ring finger tablesaw injury with nerve vessel and tendon injury.  Postop diagnosis zone 2 tablesaw injury with complete laceration to the flexor digitorum profundus near the bony insertion making this a zone 1 to injury.  Complete radial digital nerve and artery laceration and near complete ulnar digital nerve laceration to the ring finger.  Operative procedure: #1 irrigation debridement skin subtenons tissue bone and tendon left ring finger.  This was an excisional debridement to centimeter by 1 cm.  #2 flexor digitorum profundus repair left ring finger with pullout button technique the bony tunnel #3 ulnar digital nerve repair with approximation technique left ring finger #4 radial digital nerve repair with 8-0 nylon suture left ring finger #5 open treatment distal phalanx fracture left ring finger  Surgeon Dominica Severin  Anesthesia block with IV sedation  Estimated blood loss minimal  Tourniquet time less than 20 minutes  Description of procedure: Patient was seen by myself and anesthesia and taken to the operative theater where the patient underwent a smooth induction of anesthesia in the form of IV sedation.  The block was in excellent working fashion.  Arm was marked pre and postop check was complete and of operation commenced with a Hibiclens scrub followed by 10-minute surgical Betadine scrub performed by myself.  There is no obvious infection.  Sutures were removed.  Drapes were placed and timeout observed.  The operation commenced with a radial incision along with mid lateral extension.  I open the wound I debrided skin subtenons tissue tendon and bone.  The patient an obvious open fracture about the distal phalanx.  This was all curettage.  Knife scissor and curette was used for irrigation debridement.  3 L of saline was placed into the wound.  This was an excisional debridement.  Following this I then set forth  to repair the flexor digitorum profundus after thorough evaluation and exploration.  The radial digital nerve and artery were severed the ulnar digital artery was providing blood flow and the ulnar digital nerve is injured.  At this time I made a crisscross 2-0 Prolene suture placement in the FDP to mobilize this and then placed this through 2 eyelets of a Keith needle which were introduced to the open fracture site and threaded with drill dorsally.  This was then tied over a felt tip button which I created with Xeroform felt and a button technique.  This was tied down and accomplished a flexor digitorum profundus repair to the ring finger.  Following this I repaired the ulnar digital nerve with a approximation technique.  Following this prepared and performed a microneuro repair of the radial digital nerve.  Epineurial 8-0 nylon was used to repair this.  This both digital nerves and the flexor tendon were repaired.  Open treatment of the fracture was accomplished with simple debridement and setting technique.  The patient tolerated this well.  There were no complications.  I put the tourniquet up just for a brief period of time as I did not want to have a long ischemic time I did most of the operation with tourniquet deflated and refill noted to be excellent.  I was pleased with this.  The patient had the wound closed with 5-0 Prolene in a standard flexor tendon dressing applied with dorsal and volar slabs.  We will begin the flexor tendon protocol in our office for passive flexion active extension and have already discussed this with the patient had him  look this up.  All questions have been addressed.  Is a pleasure to see him today.  Chinonso Linker MD

## 2020-05-15 NOTE — H&P (Signed)
Brian Case is an 45 y.o. male.   Chief Complaint: Left ring finger and hand tablesaw injury with disarray soft tissue including tendon and nerve HPI: Patient presents for reconstructive efforts left ring finger tendon nerve artery and associate structures necessary secondary to a table saw injury.  Patient presents for evaluation and treatment of the of their upper extremity predicament. The patient denies neck, back, chest or  abdominal pain. The patient notes that they have no lower extremity problems. The patients primary complaint is noted. We are planning surgical care pathway for the upper extremity.  Past Medical History:  Diagnosis Date  . GERD (gastroesophageal reflux disease)     Past Surgical History:  Procedure Laterality Date  . HEMI-MICRODISCECTOMY LUMBAR LAMINECTOMY LEVEL 1 Left 10/11/2014   Procedure: CENTRAL DECOMPRESSION FOR STENOSIS, LAMINECTOMY, MICRODISCECTOMY LUMBAR FIVE TO SACRAL ONE LEFT;  Surgeon: Ranee Gosselin, MD;  Location: [facility];  Service: Orthopedics;  Laterality: Left;  . right knee arthroscopy     . VASECTOMY      No family history on file. Social History:  reports that he has never smoked. He has never used smokeless tobacco. He reports current alcohol use. He reports that he does not use drugs.  Allergies: No Known Allergies  Medications Prior to Admission  Medication Sig Dispense Refill  . HYDROcodone-acetaminophen (NORCO/VICODIN) 5-325 MG tablet Take 1-2 tablets by mouth every 6 (six) hours as needed for moderate pain or severe pain. 20 tablet 0  . ibuprofen (ADVIL) 600 MG tablet Take 1 tablet (600 mg total) by mouth every 6 (six) hours as needed. 30 tablet 0    No results found for this or any previous visit (from the past 48 hour(s)). No results found.  Review of Systems  Respiratory: Negative.   Cardiovascular: Negative.     Blood pressure (!) 144/75, pulse 74, temperature 98.3 F (36.8 C), temperature source Oral, resp. rate 18,  height 6\' 2"  (1.88 m), weight 90.7 kg, SpO2 100 %. Physical Exam  Loss of motion and sensation about the ring finger left hand after tablesaw injury with volar wound which extends radially as noted in my office notes.  We will plan for surgical repair reconstruction is necessary.  The patient is alert and oriented in no acute distress. The patient complains of pain in the affected upper extremity.  The patient is noted to have a normal HEENT exam. Lung fields show equal chest expansion and no shortness of breath. Abdomen exam is nontender without distention. Lower extremity examination does not show any fracture dislocation or blood clot symptoms. Pelvis is stable and the neck and back are stable and nontender. Assessment/Plan We will plan for irrigation debridement repair of structures as necessary including tendon artery and nerve and associated soft tissue structures as indicated.  All questions have been addressed.  We are planning surgery for your upper extremity. The risk and benefits of surgery to include risk of bleeding, infection, anesthesia,  damage to normal structures and failure of the surgery to accomplish its intended goals of relieving symptoms and restoring function have been discussed in detail. With this in mind we plan to proceed. I have specifically discussed with the patient the pre-and postoperative regime and the dos and don'ts and risk and benefits in great detail. Risk and benefits of surgery also include risk of dystrophy(CRPS), chronic nerve pain, failure of the healing process to go onto completion and other inherent risks of surgery The relavent the pathophysiology of the disease/injury process, as well  as the alternatives for treatment and postoperative course of action has been discussed in great detail with the patient who desires to proceed.  We will do everything in our power to help you (the patient) restore function to the upper extremity. It is a pleasure to  see this patient today.   Oletta Cohn III, MD 05/15/2020, 4:04 PM

## 2020-05-15 NOTE — Discharge Instructions (Signed)
Your flexor tendon repair went just fine.  We also repaired your nerves.  Please do not move your fingers.  Please elevate keep the bandage clean and dry and our office will call for your follow-up.  We recommend vitamin B6 100 mg for nerve health and vitamin C 1000 mg for wound healing.  Please eat a good diet and take good care of yourself  Please call for any problems.  If you take pain medicine we recommend stool softener.  Peri-Colace twice daily/twice a day and milk of magnesia if necessary.  Please not hesitate to call our office should any problems occur.

## 2020-05-15 NOTE — Transfer of Care (Signed)
Immediate Anesthesia Transfer of Care Note  Patient: Brian Case  Procedure(s) Performed: Irrigation and debridement left ring finger with repair tendon, nerve, and artery as necessary (Left Finger)  Patient Location: PACU  Anesthesia Type:MAC combined with regional for post-op pain  Level of Consciousness: awake, alert , oriented and patient cooperative  Airway & Oxygen Therapy: Patient Spontanous Breathing  Post-op Assessment: Report given to RN and Post -op Vital signs reviewed and stable  Post vital signs: Reviewed and stable  Last Vitals:  Vitals Value Taken Time  BP 114/73 05/15/20 1944  Temp    Pulse 55 05/15/20 1946  Resp 17 05/15/20 1946  SpO2 99 % 05/15/20 1946  Vitals shown include unvalidated device data.  Last Pain:  Vitals:   05/15/20 1745  TempSrc:   PainSc: 0-No pain      Patients Stated Pain Goal: 5 (83/47/58 [id number])  Complications: No complications documented.

## 2020-05-15 NOTE — Anesthesia Procedure Notes (Signed)
Anesthesia Regional Block: Supraclavicular block   Pre-Anesthetic Checklist: ,, timeout performed, Correct Patient, Correct Site, Correct Laterality, Correct Procedure, Correct Position, site marked, Risks and benefits discussed,  Surgical consent,  Pre-op evaluation,  At surgeon's request and post-op pain management  Laterality: Left  Prep: chloraprep       Needles:  Injection technique: Single-shot  Needle Type: Stimiplex     Needle Length: 9cm  Needle Gauge: 21     Additional Needles:   Procedures:,,,, ultrasound used (permanent image in chart),,,,  Narrative:  Start time: 05/15/2020 5:31 PM End time: 05/15/2020 5:36 PM Injection made incrementally with aspirations every 5 mL.  Performed by: Personally  Anesthesiologist: Lowella Curb, MD

## 2020-05-16 ENCOUNTER — Other Ambulatory Visit (HOSPITAL_COMMUNITY): Payer: 59

## 2020-05-16 ENCOUNTER — Encounter (HOSPITAL_COMMUNITY): Payer: Self-pay | Admitting: Orthopedic Surgery

## 2020-05-18 ENCOUNTER — Ambulatory Visit (HOSPITAL_BASED_OUTPATIENT_CLINIC_OR_DEPARTMENT_OTHER): Admit: 2020-05-18 | Payer: 59 | Admitting: Plastic Surgery

## 2020-05-18 SURGERY — TENDON REPAIR
Anesthesia: General | Site: Ring Finger | Laterality: Left

## 2020-05-28 ENCOUNTER — Encounter: Payer: 59 | Admitting: Plastic Surgery

## 2021-12-10 IMAGING — CR DG FINGER RING 2+V*L*
3 series · 3 of 3 positions shown · non-contrast
Comparison: None.

CLINICAL DATA: Laceration with a saw.

EXAM:
LEFT RING FINGER 2+V

[finger ap]
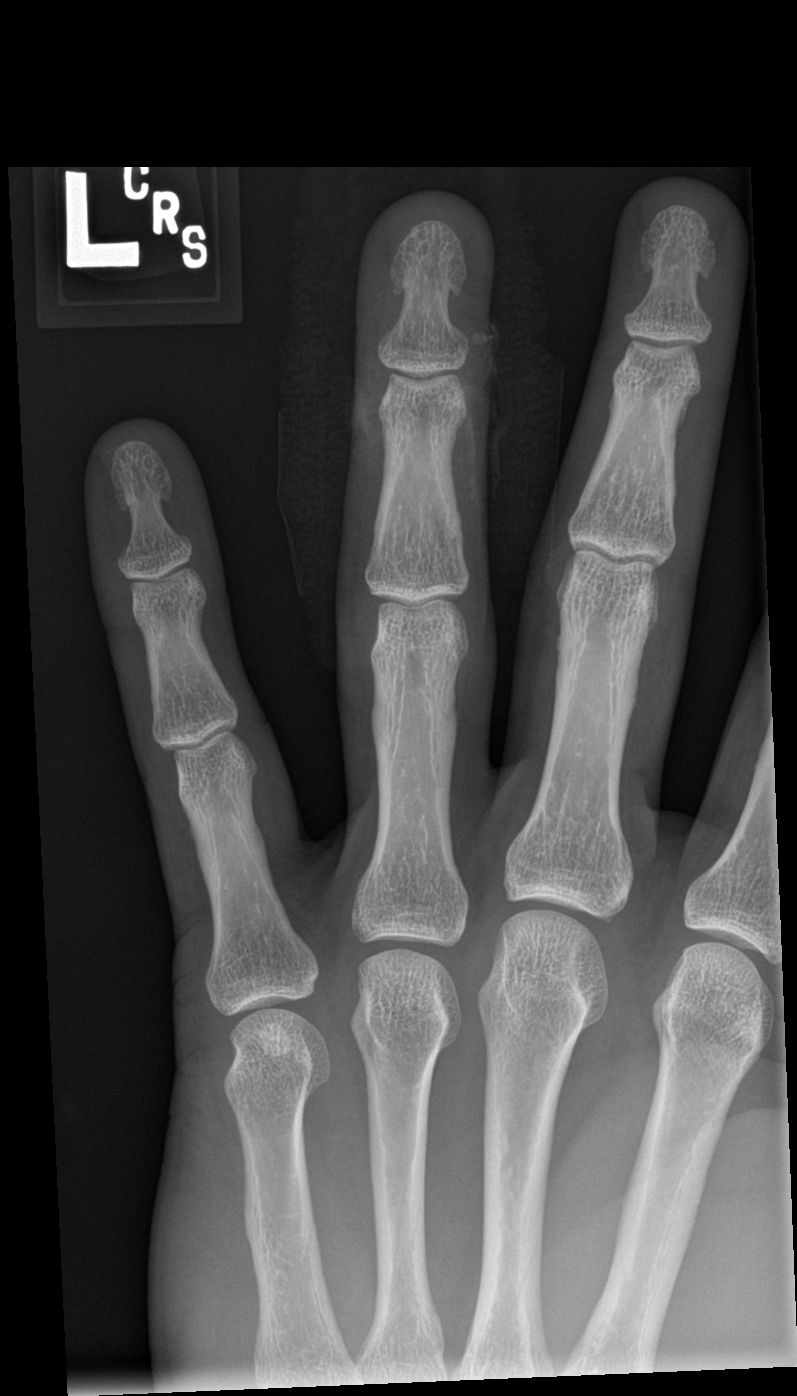

[finger obl]
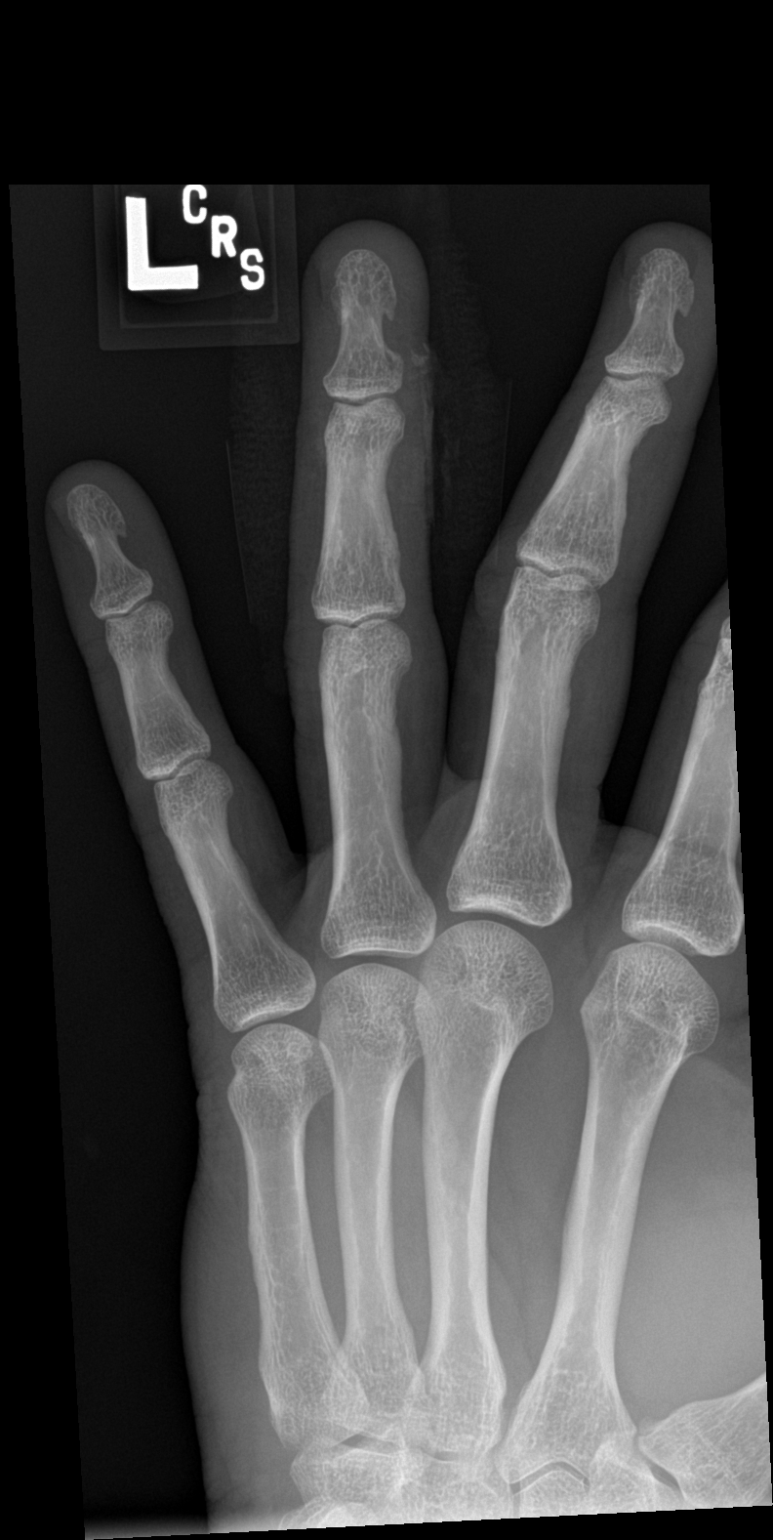

[finger lat]
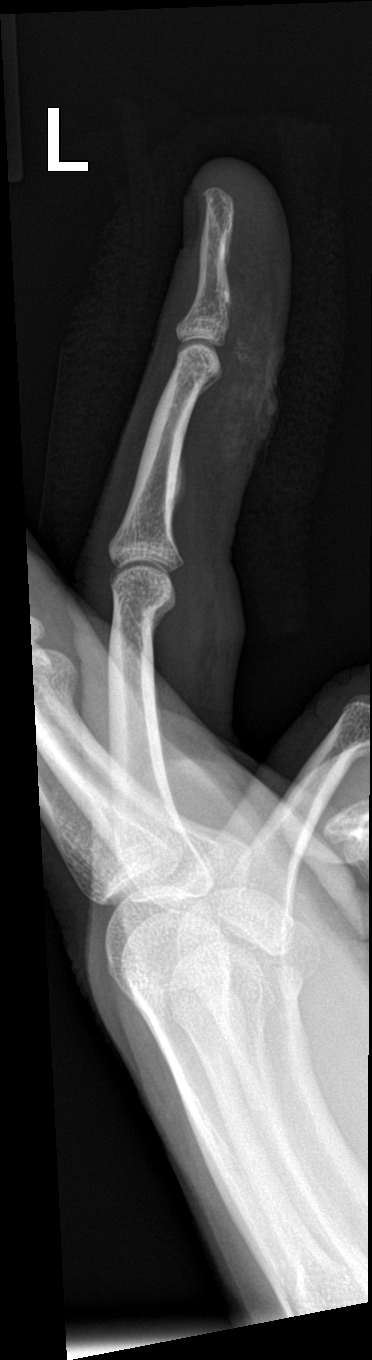

[3 of 3 positions shown; findings below may reference images not displayed]

FINDINGS: There is no evidence of fracture or dislocation. There is no
evidence of arthropathy or other focal bone abnormality. There is
soft tissue stranding along the palmar aspect of the fourth digit
near the D IP joint, likely representing known laceration. No
radiopaque foreign body identified.
IMPRESSION: No acute osseous abnormality. No radiopaque foreign body identified.
